# Patient Record
Sex: Male | Born: 1965
Health system: Southern US, Community
[De-identification: ages and names within clinical notes are randomized; demographics above are authoritative.]

## PROBLEM LIST (undated history)

## (undated) DIAGNOSIS — Z86718 Personal history of other venous thrombosis and embolism: Secondary | ICD-10-CM

## (undated) DIAGNOSIS — Z1211 Encounter for screening for malignant neoplasm of colon: Secondary | ICD-10-CM

## (undated) DIAGNOSIS — G4730 Sleep apnea, unspecified: Secondary | ICD-10-CM

## (undated) DIAGNOSIS — Z0000 Encounter for general adult medical examination without abnormal findings: Secondary | ICD-10-CM

## (undated) DIAGNOSIS — E785 Hyperlipidemia, unspecified: Secondary | ICD-10-CM

## (undated) DIAGNOSIS — F1910 Other psychoactive substance abuse, uncomplicated: Secondary | ICD-10-CM

## (undated) DIAGNOSIS — R7989 Other specified abnormal findings of blood chemistry: Principal | ICD-10-CM

## (undated) DIAGNOSIS — I82409 Acute embolism and thrombosis of unspecified deep veins of unspecified lower extremity: Secondary | ICD-10-CM

## (undated) DIAGNOSIS — R739 Hyperglycemia, unspecified: Secondary | ICD-10-CM

## (undated) DIAGNOSIS — E669 Obesity, unspecified: Secondary | ICD-10-CM

## (undated) DIAGNOSIS — R05 Cough: Secondary | ICD-10-CM

## (undated) HISTORY — DX: Acute embolism and thrombosis of unspecified deep veins of unspecified lower extremity: I82.409

## (undated) HISTORY — DX: Other psychoactive substance abuse, uncomplicated: F19.10

## (undated) HISTORY — DX: Obesity, unspecified: E66.9

## (undated) HISTORY — DX: Other specified abnormal findings of blood chemistry: R79.89

## (undated) HISTORY — DX: Sleep apnea, unspecified: G47.30

## (undated) HISTORY — DX: Personal history of other venous thrombosis and embolism: Z86.718

## (undated) HISTORY — DX: Encounter for screening for malignant neoplasm of colon: Z12.11

## (undated) HISTORY — DX: Cough: R05

## (undated) HISTORY — DX: Hyperlipidemia, unspecified: E78.5

## (undated) HISTORY — DX: Encounter for general adult medical examination without abnormal findings: Z00.00

## (undated) HISTORY — PX: OTHER SURGICAL HISTORY: SHX169

## (undated) HISTORY — DX: Hyperglycemia, unspecified: R73.9

---

## 1990-09-02 HISTORY — PX: SKIN GRAFT SPLIT THICKNESS TRUNK: SUR1304

## 1990-09-02 HISTORY — PX: SKIN GRAFT SPLIT THICKNESS LEG / FOOT: SUR1303

## 1990-09-02 HISTORY — PX: ORIF FEMUR FRACTURE: SHX2119

## 2010-01-26 ENCOUNTER — Ambulatory Visit: Payer: Self-pay | Admitting: Internal Medicine

## 2010-01-26 DIAGNOSIS — L02229 Furuncle of trunk, unspecified: Secondary | ICD-10-CM

## 2010-01-26 DIAGNOSIS — L02239 Carbuncle of trunk, unspecified: Secondary | ICD-10-CM | POA: Insufficient documentation

## 2010-01-26 DIAGNOSIS — H8110 Benign paroxysmal vertigo, unspecified ear: Secondary | ICD-10-CM | POA: Insufficient documentation

## 2010-01-26 LAB — CONVERTED CEMR LAB
BUN: 9 mg/dL (ref 6–23)
CO2: 24 meq/L (ref 19–32)
Glucose, Bld: 97 mg/dL (ref 70–99)
Hgb A1c MFr Bld: 6 % — ABNORMAL HIGH (ref <5.7)
Potassium: 4.8 meq/L (ref 3.5–5.3)
Sodium: 138 meq/L (ref 135–145)
TSH: 1.032 microintl units/mL (ref 0.350–4.500)
Total CHOL/HDL Ratio: 4.7
VLDL: 33 mg/dL (ref 0–40)

## 2010-01-31 ENCOUNTER — Encounter: Payer: Self-pay | Admitting: Internal Medicine

## 2010-04-30 ENCOUNTER — Ambulatory Visit: Payer: Self-pay | Admitting: Internal Medicine

## 2010-04-30 DIAGNOSIS — M25539 Pain in unspecified wrist: Secondary | ICD-10-CM | POA: Insufficient documentation

## 2010-10-02 NOTE — Assessment & Plan Note (Signed)
Summary: 3 month fu/dt   Vital Signs:  Patient profile:   45 year old male Weight:      273.75 pounds BMI:     41.47 O2 Sat:      100 % on Room air Temp:     98.1 degrees F oral Pulse rate:   72 / minute Pulse rhythm:   regular Resp:     16 per minute BP sitting:   110 / 70  (right arm) Cuff size:   large  Vitals Entered By: Glendell Docker CMA (April 30, 2010 9:06 AM)  O2 Flow:  Room air CC: 3 Month Follow up  Is Patient Diabetic? Yes Pain Assessment Patient in pain? no      Comments evaluation of lump on left wrist   Primary Care [facility]:  Dondra Spry DO  CC:  3 Month Follow up .  History of Present Illness: 45 y/o AA male c/o left wrist bump no hx of recent trauma or fall occ bothers him minimal tenderness  obesity - exercising regularly - mainly walking lost 6 lbs  prev boil on back resolved doxy  Preventive Screening-Counseling & Management  Alcohol-Tobacco     Smoking Status: never  Allergies (verified): No Known Drug Allergies  Past History:  Past Medical History: Obesity  Family History: Family History of Arthritis Family History Breast cancer Family History of Colon CA - MGF (later in life) Family History High cholesterol - M Family History Hypertension -M  Family History Kidney disease - uncle Family History of Prostate CA - grandfather (died of prostate ca -died in his 48s) Family History of Stroke F 1st degree relative <60  father died of liver cancer at 89  Mother is 68   Social History: Occupation:  Engineer, 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   Lungs:  normal respiratory effort, normal breath sounds, no crackles, and no wheezes.   Heart:  normal rate, regular rhythm, and no gallop.   Msk:  left wrist - slightly prominence to dorsal wrist near  radius   Impression & Recommendations:  Problem # 1:  WRIST PAIN, LEFT (ICD-719.43) intermittent left wrist pain.  small prominent area doral aspect of wrist - question bony prominence vs ganglion cyst observe for now.  use wrist splint to see if it helps x 1-2 weeks If it becomes more symptomatic, consider referral to hand specialist  Problem # 2:  MORBID OBESITY (ICD-278.01) Pt counseled on diet and exercise.  he increased physical activity.  lost 6 lbs since June A1c 6.0  Patient Instructions: 1)  Please schedule a follow-up appointment in 6 months. 2)  BMP prior to visit, ICD-9:  790.29 3)  HbgA1C prior to visit, ICD-9: 790.29 4)  Please return for lab work one (1) week before your next appointment.  5)  Goal weight 250 lbs within 6 months  Current Allergies (reviewed today): No known allergies   Appended Document: Orders Update    Clinical Lists Changes  Orders: Added new Service order of Influenza Vaccine NON MCR (04540) - Signed Added new Service order of Admin 1st Vaccine ([id number]) - Signed Observations: Added new observation of FLU VAX#1VIS: 03/27/2010 (04/30/2010 10:07) Added new observation of FLU VAXLOT: [id number] (04/30/2010 10:07) Added new observation of FLU VAX EXP: 03/02/2011 (04/30/2010 10:07) Added new observation of FLU VAXBY: Darlene  Knight CMA (04/30/2010 10:07) Added new observation of FLU VAXRTE: IM (04/30/2010 10:07) Added new observation of FLU VAX DSE: 0.5 ml (04/30/2010 10:07) Added new observation of FLU VAXMFR: GlaxoSmithKline (04/30/2010 10:07) Added new observation of FLU VAX SITE: left deltoid (04/30/2010 10:07) Added new observation of FLU VAX: Fluvax Non-MCR (04/30/2010 10:07)       Immunizations Administered:  Influenza Vaccine # 1:    Vaccine Type: Fluvax Non-MCR    Site: left deltoid    Mfr: GlaxoSmithKline    Dose: 0.5 ml    Route: IM    Given by: Glendell Docker CMA    Exp. Date: 03/02/2011    Lot #: [id number]    VIS  given: 03/27/2010  Flu Vaccine Consent Questions:    Do you have a history of severe allergic reactions to this vaccine? no    Any prior history of allergic reactions to egg and/or gelatin? no    Do you have a sensitivity to the preservative Thimersol? no    Do you have a past history of Guillan-Barre Syndrome? no    Do you currently have an acute febrile illness? no    Have you ever had a severe reaction to latex? no    Vaccine information given and explained to patient? yes
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
## 2010-10-02 NOTE — Letter (Signed)
    at [facility] [address] [place], [place]  [address]  [place] Phone: [phone]      January 31, 2010   DEMARQUIS Popov [address] [place], [place] [address]  RE:  LAB RESULTS  Dear  Mr. SIRICO,  The following is an interpretation of your most recent lab tests.  Please take note of any instructions provided or changes to medications that have resulted from your lab work.  ELECTROLYTES:  Good - no changes needed  KIDNEY FUNCTION TESTS:  Good - no changes needed  LIPID PANEL:  Fair - review at your next visit Triglyceride: 166   Cholesterol: 191   LDL: 117   HDL: 41   Chol/HDL%:  4.7 Ratio  THYROID STUDIES:  Thyroid studies normal TSH: 1.032     DIABETIC STUDIES:  Fair - schedule a follow-up appointment Blood Glucose: 97   HgbA1C: 6.0     Your blood work shows you are a borderline diabetic.  Please avoid sweets and limit your carbohydrate intake to 25-30 grams per meal ( 90 grams per day).  I will further discuss your lab results at your next follow up appointment.         Sincerely Yours,    Dr. Thomos Lemons

## 2010-10-29 ENCOUNTER — Ambulatory Visit (INDEPENDENT_AMBULATORY_CARE_PROVIDER_SITE_OTHER): Payer: 59 | Admitting: Internal Medicine

## 2010-10-29 ENCOUNTER — Encounter: Payer: Self-pay | Admitting: Internal Medicine

## 2010-10-29 DIAGNOSIS — R7309 Other abnormal glucose: Secondary | ICD-10-CM | POA: Insufficient documentation

## 2010-10-29 DIAGNOSIS — Z125 Encounter for screening for malignant neoplasm of prostate: Secondary | ICD-10-CM
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
## 2011-06-17 ENCOUNTER — Telehealth: Payer: Self-pay | Admitting: Internal Medicine

## 2011-06-17 DIAGNOSIS — Z125 Encounter for screening for malignant neoplasm of prostate: Secondary | ICD-10-CM

## 2011-06-17 DIAGNOSIS — Z0000 Encounter for general adult medical examination without abnormal findings: Secondary | ICD-10-CM

## 2011-06-17 NOTE — Telephone Encounter (Signed)
CPX labs ,cbc, Lipid, Hepatic, Bmp, TSH, Urine Microscopic,- A1c are there any other labs needed for patient ?

## 2011-06-17 NOTE — Telephone Encounter (Signed)
ua with reflex micro instead of urine micro. Also last noted 2011 states he had requested prostate cancer screening with psa but don't see results. Assume he still wants this done so add psa pls

## 2011-06-17 NOTE — Telephone Encounter (Signed)
Patient has cpe on 10/21/11. Please schedule labs one week prior. Patient will be going downstairs to [facility].

## 2011-06-17 NOTE — Telephone Encounter (Signed)
Physical labs entered for February 2013 at [facility].

## 2011-10-21 ENCOUNTER — Encounter: Payer: 59 | Admitting: Internal Medicine

## 2011-10-30 ENCOUNTER — Other Ambulatory Visit: Payer: Self-pay | Admitting: *Deleted

## 2011-10-30 DIAGNOSIS — Z125 Encounter for screening for malignant neoplasm of prostate: Secondary | ICD-10-CM

## 2011-10-30 DIAGNOSIS — Z0000 Encounter for general adult medical examination without abnormal findings: Secondary | ICD-10-CM

## 2011-10-30 LAB — HEPATIC FUNCTION PANEL
ALT: 33 U/L (ref 0–53)
Bilirubin, Direct: 0.1 mg/dL (ref 0.0–0.3)
Indirect Bilirubin: 0.3 mg/dL (ref 0.0–0.9)
Total Bilirubin: 0.4 mg/dL (ref 0.3–1.2)

## 2011-10-30 LAB — TSH: TSH: 3.711 u[IU]/mL (ref 0.350–4.500)

## 2011-10-30 LAB — BASIC METABOLIC PANEL
CO2: 25 mEq/L (ref 19–32)
Calcium: 8.7 mg/dL (ref 8.4–10.5)
Chloride: 104 mEq/L (ref 96–112)
Glucose, Bld: 93 mg/dL (ref 70–99)
Potassium: 4.6 mEq/L (ref 3.5–5.3)
Sodium: 139 mEq/L (ref 135–145)

## 2011-10-30 LAB — LIPID PANEL
LDL Cholesterol: 101 mg/dL — ABNORMAL HIGH (ref 0–99)
Total CHOL/HDL Ratio: 5.1 Ratio
VLDL: 44 mg/dL — ABNORMAL HIGH (ref 0–40)

## 2011-10-31 ENCOUNTER — Encounter: Payer: Self-pay | Admitting: Internal Medicine

## 2011-10-31 ENCOUNTER — Telehealth: Payer: Self-pay | Admitting: Internal Medicine

## 2011-10-31 ENCOUNTER — Ambulatory Visit (INDEPENDENT_AMBULATORY_CARE_PROVIDER_SITE_OTHER): Payer: 59 | Admitting: Internal Medicine

## 2011-10-31 DIAGNOSIS — E781 Pure hyperglyceridemia: Secondary | ICD-10-CM

## 2011-10-31 DIAGNOSIS — R7309 Other abnormal glucose: Secondary | ICD-10-CM

## 2011-10-31 DIAGNOSIS — E785 Hyperlipidemia, unspecified: Secondary | ICD-10-CM

## 2011-10-31 DIAGNOSIS — Z0000 Encounter for general adult medical examination without abnormal findings: Secondary | ICD-10-CM

## 2011-10-31 DIAGNOSIS — Z23 Encounter for immunization: Secondary | ICD-10-CM

## 2011-10-31 LAB — CBC
MCHC: 32.3 g/dL (ref 30.0–36.0)
Platelets: 255 10*3/uL (ref 150–400)
RDW: 12.9 % (ref 11.5–15.5)
WBC: 8.4 10*3/uL (ref 4.0–10.5)

## 2011-10-31 LAB — URINALYSIS, MICROSCOPIC ONLY: Casts: NONE SEEN

## 2011-10-31 LAB — URINALYSIS, ROUTINE W REFLEX MICROSCOPIC
Hgb urine dipstick: NEGATIVE
Leukocytes, UA: NEGATIVE
Nitrite: NEGATIVE
Protein, ur: NEGATIVE mg/dL
Urobilinogen, UA: 0.2 mg/dL (ref 0.0–1.0)

## 2011-10-31 NOTE — Telephone Encounter (Signed)
Future lab order placed for the week of 04/20/12 and copy forwarded to the lab.

## 2011-10-31 NOTE — Patient Instructions (Signed)
Please schedule chem7, a1c (hyperglycemia) and lipid 272.4 prior to next visit 

## 2011-10-31 NOTE — Assessment & Plan Note (Signed)
Nl exam. ekg obtained demonstrates nsr 78 with nl intervals and axis.

## 2011-10-31 NOTE — Assessment & Plan Note (Signed)
Mild but reviewed potential for development of DM. Recommend low sugar/carb diet, regular aerobic exercise and wt loss.

## 2011-10-31 NOTE — Assessment & Plan Note (Signed)
Low fat diet, regular aerobic exercise and wt loss recommended.

## 2011-10-31 NOTE — Progress Notes (Signed)
  Subjective:    Patient ID: Ross Spencer, male    DOB: 1966-04-07, 46 y.o.   MRN: [id number]  HPI Pt presents to clinic for annual physical. Reviewed h/o hyperglycemia- a1c improved mildly to 5.9. No diagnostic criteria noted for dm. TG reviewed elevated and has gained wt since last visit. No active complaints.  No past medical history on file. Past Surgical History  Procedure Date  . No past surgeries 10/31/2011    reports that he has never smoked. He has never used smokeless tobacco. He reports that he does not drink alcohol or use illicit drugs. family history includes Breast cancer in his maternal grandmother; Hypertension in his mother; and Prostate cancer in his maternal uncle.  There is no history of Colon cancer, and Diabetes, and Heart disease, and Hyperlipidemia, . No Known Allergies   Review of Systems see hpi     Objective:   Physical Exam  Nursing note and vitals reviewed. Constitutional: He appears well-developed and well-nourished. No distress.  HENT:  Head: Normocephalic and atraumatic.  Right Ear: Tympanic membrane, external ear and ear canal normal.  Left Ear: Tympanic membrane, external ear and ear canal normal.  Nose: Nose normal.  Mouth/Throat: Oropharynx is clear and moist. No oropharyngeal exudate.  Eyes: Conjunctivae and EOM are normal. Pupils are equal, round, and reactive to light. No scleral icterus.  Neck: Neck supple. No thyromegaly present.  Cardiovascular: Normal rate, regular rhythm and normal heart sounds.  Exam reveals no gallop and no friction rub.   No murmur heard. Pulmonary/Chest: Effort normal and breath sounds normal. No respiratory distress. He has no wheezes. He has no rales.  Abdominal: Soft. Bowel sounds are normal. He exhibits no distension and no mass. There is no hepatosplenomegaly. There is no tenderness. There is no rebound and no guarding.  Lymphadenopathy:    He has no cervical adenopathy.  Neurological: He is alert.  Skin:  Skin is warm and dry. He is not diaphoretic.  Psychiatric: He has a normal mood and affect.          Assessment & Plan:

## 2012-04-24 LAB — LIPID PANEL
Cholesterol: 199 mg/dL (ref 0–200)
HDL: 39 mg/dL — ABNORMAL LOW (ref >39)
LDL Cholesterol: 116 mg/dL — ABNORMAL HIGH (ref 0–99)
Total CHOL/HDL Ratio: 5.1 Ratio
Triglycerides: 222 mg/dL — ABNORMAL HIGH (ref <150)
VLDL: 44 mg/dL — ABNORMAL HIGH (ref 0–40)

## 2012-04-24 LAB — BASIC METABOLIC PANEL
CO2: 28 mEq/L (ref 19–32)
Chloride: 104 mEq/L (ref 96–112)
Creat: 0.93 mg/dL (ref 0.50–1.35)

## 2012-04-24 NOTE — Addendum Note (Signed)
Addended by: Regis Bill on: 04/24/2012 08:15 AM   Modules accepted: Orders

## 2012-04-27 ENCOUNTER — Encounter: Payer: Self-pay | Admitting: Internal Medicine

## 2012-04-27 ENCOUNTER — Telehealth: Payer: Self-pay | Admitting: Internal Medicine

## 2012-04-27 ENCOUNTER — Ambulatory Visit (INDEPENDENT_AMBULATORY_CARE_PROVIDER_SITE_OTHER): Payer: 59 | Admitting: Internal Medicine

## 2012-04-27 VITALS — BP 108/72 | HR 86 | Temp 98.2°F | Resp 16 | Ht 68.25 in | Wt 283.8 lb

## 2012-04-27 DIAGNOSIS — E781 Pure hyperglyceridemia: Secondary | ICD-10-CM

## 2012-04-27 DIAGNOSIS — R7309 Other abnormal glucose: Secondary | ICD-10-CM

## 2012-04-27 DIAGNOSIS — Z0000 Encounter for general adult medical examination without abnormal findings: Secondary | ICD-10-CM

## 2012-04-27 NOTE — Assessment & Plan Note (Signed)
Stable despite weight increase. Strongly encouraged need for diet changes, regular exercise and wt loss. Repeat labs with cpe next visit

## 2012-04-27 NOTE — Patient Instructions (Signed)
Please schedule fasting labs prior to next appointment Cbc, chem7, lft, a1c, tsh, lipid, ua with reflex micro and psa-v70.0

## 2012-04-27 NOTE — Assessment & Plan Note (Signed)
Low fat diet and exercise.

## 2012-04-27 NOTE — Assessment & Plan Note (Signed)
Begin exercise program

## 2012-04-27 NOTE — Telephone Encounter (Signed)
Please schedule fasting labs prior to next appointment  Cbc, chem7, lft, a1c, tsh, lipid, ua with reflex micro and psa-v70.0  Patient has upcoming appointment March/2014. He will be going to [facility] lab

## 2012-04-27 NOTE — Progress Notes (Signed)
  Subjective:    Patient ID: Ross Spencer, male    DOB: [birth year], 46 y.o.   MRN: [id number]  HPI Pt presents to clinic for followup of multiple medical problems. Wt up 4lbs since last visit. Reviewed persistent but stable hyperglycemia with a1c 6.0. TG reviewed also elevated and stable. Not exercising regularly. No complaint.  No past medical history on file. Past Surgical History  Procedure Date  . No past surgeries 10/31/2011    reports that he has never smoked. He has never used smokeless tobacco. He reports that he does not drink alcohol or use illicit drugs. family history includes Breast cancer in his maternal grandmother; Hypertension in his mother; and Prostate cancer in his maternal uncle.  There is no history of Colon cancer, and Diabetes, and Heart disease, and Hyperlipidemia, . No Known Allergies    Review of Systems see hpi     Objective:   Physical Exam  Physical Exam  Nursing note and vitals reviewed. Constitutional: Appears well-developed and well-nourished. No distress.  HENT:  Head: Normocephalic and atraumatic.  Right Ear: External ear normal.  Left Ear: External ear normal.  Eyes: Conjunctivae are normal. No scleral icterus.  Neck: Neck supple. Carotid bruit is not present.  Cardiovascular: Normal rate, regular rhythm and normal heart sounds.  Exam reveals no gallop and no friction rub.   No murmur heard. Pulmonary/Chest: Effort normal and breath sounds normal. No respiratory distress. He has no wheezes. no rales.  Lymphadenopathy:    He has no cervical adenopathy.  Neurological:Alert.  Skin: Skin is warm and dry. Not diaphoretic.  Psychiatric: Has a normal mood and affect.        Assessment & Plan:

## 2012-05-07 NOTE — Telephone Encounter (Signed)
Future orders placed and copy given to the lab.

## 2012-10-29 ENCOUNTER — Other Ambulatory Visit: Payer: Self-pay

## 2012-10-29 DIAGNOSIS — Z0000 Encounter for general adult medical examination without abnormal findings: Secondary | ICD-10-CM

## 2012-10-29 LAB — URINALYSIS, ROUTINE W REFLEX MICROSCOPIC
Bilirubin Urine: NEGATIVE
Glucose, UA: NEGATIVE mg/dL
Hgb urine dipstick: NEGATIVE
Leukocytes, UA: NEGATIVE
Protein, ur: NEGATIVE mg/dL

## 2012-10-29 LAB — LIPID PANEL
Cholesterol: 188 mg/dL (ref 0–200)
Total CHOL/HDL Ratio: 4.6 Ratio
VLDL: 35 mg/dL (ref 0–40)

## 2012-10-29 LAB — HEMOGLOBIN A1C
Hgb A1c MFr Bld: 5.9 % — ABNORMAL HIGH (ref <5.7)
Mean Plasma Glucose: 123 mg/dL — ABNORMAL HIGH (ref <117)

## 2012-10-29 LAB — HEPATIC FUNCTION PANEL
AST: 20 U/L (ref 0–37)
Alkaline Phosphatase: 63 U/L (ref 39–117)
Bilirubin, Direct: 0.1 mg/dL (ref 0.0–0.3)
Total Bilirubin: 0.4 mg/dL (ref 0.3–1.2)

## 2012-10-29 LAB — BASIC METABOLIC PANEL
Potassium: 4.4 mEq/L (ref 3.5–5.3)
Sodium: 139 mEq/L (ref 135–145)

## 2012-10-30 LAB — CBC WITH DIFFERENTIAL/PLATELET
Basophils Relative: 0 % (ref 0–1)
Hemoglobin: 15.6 g/dL (ref 13.0–17.0)
Lymphocytes Relative: 52 % — ABNORMAL HIGH (ref 12–46)
Lymphs Abs: 4.4 10*3/uL — ABNORMAL HIGH (ref 0.7–4.0)
Monocytes Relative: 9 % (ref 3–12)
Neutro Abs: 3.1 10*3/uL (ref 1.7–7.7)
Neutrophils Relative %: 37 % — ABNORMAL LOW (ref 43–77)
RBC: 5.54 MIL/uL (ref 4.22–5.81)
WBC: 8.5 10*3/uL (ref 4.0–10.5)

## 2012-11-03 ENCOUNTER — Encounter: Payer: 59 | Admitting: Family Medicine

## 2012-11-24 ENCOUNTER — Encounter: Payer: Self-pay | Admitting: Family Medicine

## 2012-11-24 ENCOUNTER — Ambulatory Visit (INDEPENDENT_AMBULATORY_CARE_PROVIDER_SITE_OTHER): Payer: 59 | Admitting: Family Medicine

## 2012-11-24 VITALS — BP 132/84 | HR 93 | Temp 98.2°F | Ht 68.25 in | Wt 282.1 lb

## 2012-11-24 DIAGNOSIS — E785 Hyperlipidemia, unspecified: Secondary | ICD-10-CM

## 2012-11-24 DIAGNOSIS — E669 Obesity, unspecified: Secondary | ICD-10-CM | POA: Insufficient documentation

## 2012-11-24 DIAGNOSIS — R7989 Other specified abnormal findings of blood chemistry: Secondary | ICD-10-CM

## 2012-11-24 DIAGNOSIS — R946 Abnormal results of thyroid function studies: Secondary | ICD-10-CM

## 2012-11-24 DIAGNOSIS — Z0000 Encounter for general adult medical examination without abnormal findings: Secondary | ICD-10-CM

## 2012-11-24 DIAGNOSIS — E782 Mixed hyperlipidemia: Secondary | ICD-10-CM | POA: Insufficient documentation

## 2012-11-24 DIAGNOSIS — R7309 Other abnormal glucose: Secondary | ICD-10-CM

## 2012-11-24 DIAGNOSIS — H8110 Benign paroxysmal vertigo, unspecified ear: Secondary | ICD-10-CM

## 2012-11-24 HISTORY — DX: Other specified abnormal findings of blood chemistry: R79.89

## 2012-11-24 NOTE — Assessment & Plan Note (Signed)
Elevated TSH noted, repeat TSH with free T4 today

## 2012-11-24 NOTE — Assessment & Plan Note (Signed)
Mild, encouraged avoid trans fats, add exercise, minimize simple carbs and saturated fats and add a krill or fish oil cap daily
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
## 2012-11-24 NOTE — Assessment & Plan Note (Signed)
Encouraged adding regular exercise, avoid trans fats and maintain a heart healthy diet.

## 2012-11-24 NOTE — Assessment & Plan Note (Signed)
hgba1c 5.9, stable, minimize simple carbs.

## 2012-11-24 NOTE — Patient Instructions (Addendum)
Start a krill oil or fish oil cap daily, MegaRed by [employer] No trans fats/partially hydrogenated oils  Labs prior to visit, hgba1c, lipid, renal, cbc, tsh, freeT4, hepatic  Preventive Care for Adults, Male A healthy lifestyle and preventive care can promote health and wellness. Preventive health guidelines for men include the following key practices:  A routine yearly physical is a good way to check with your caregiver about your health and preventative screening. It is a chance to share any concerns and updates on your health, and to receive a thorough exam.  Visit your dentist for a routine exam and preventative care every 6 months. Brush your teeth twice a day and floss once a day. Good oral hygiene prevents tooth decay and gum disease.  The frequency of eye exams is based on your age, health, family medical history, use of contact lenses, and other factors. Follow your caregiver's recommendations for frequency of eye exams.  Eat a healthy diet. Foods like vegetables, fruits, whole grains, low-fat dairy products, and lean protein foods contain the nutrients you need without too many calories. Decrease your intake of foods high in solid fats, added sugars, and salt. Eat the right amount of calories for you.Get information about a proper diet from your caregiver, if necessary.  Regular physical exercise is one of the most important things you can do for your health. Most adults should get at least 150 minutes of moderate-intensity exercise (any activity that increases your heart rate and causes you to sweat) each week. In addition, most adults need muscle-strengthening exercises on 2 or more days a week.  Maintain a healthy weight. The body mass index (BMI) is a screening tool to identify possible weight problems. It provides an estimate of body fat based on height and weight. Your caregiver can help determine your BMI, and can help you achieve or maintain a healthy weight.For adults 20 years and  older:  A BMI below 18.5 is considered underweight.  A BMI of 18.5 to 24.9 is normal.  A BMI of 25 to 29.9 is considered overweight.  A BMI of 30 and above is considered obese.  Maintain normal blood lipids and cholesterol levels by exercising and minimizing your intake of saturated fat. Eat a balanced diet with plenty of fruit and vegetables. Blood tests for lipids and cholesterol should begin at age 6 and be repeated every 5 years. If your lipid or cholesterol levels are high, you are over 50, or you are a high risk for heart disease, you may need your cholesterol levels checked more frequently.Ongoing high lipid and cholesterol levels should be treated with medicines if diet and exercise are not effective.  If you smoke, find out from your caregiver how to quit. If you do not use tobacco, do not start.  If you choose to drink alcohol, do not exceed 2 drinks per day. One drink is considered to be 12 ounces (355 mL) of beer, 5 ounces (148 mL) of wine, or 1.5 ounces (44 mL) of liquor.  Avoid use of street drugs. Do not share needles with anyone. Ask for help if you need support or instructions about stopping the use of drugs.  High blood pressure causes heart disease and increases the risk of stroke. Your blood pressure should be checked at least every 1 to 2 years. Ongoing high blood pressure should be treated with medicines, if weight loss and exercise are not effective.  If you are 71 to 46 years old, ask your caregiver if you  should take aspirin to prevent heart disease.  Diabetes screening involves taking a blood sample to check your fasting blood sugar level. This should be done once every 3 years, after age 89, if you are within normal weight and without risk factors for diabetes. Testing should be considered at a younger age or be carried out more frequently if you are overweight and have at least 1 risk factor for diabetes.  Colorectal cancer can be detected and often prevented.  Most routine colorectal cancer screening begins at the age of 55 and continues through age 48. However, your caregiver may recommend screening at an earlier age if you have risk factors for colon cancer. On a yearly basis, your caregiver may provide home test kits to check for hidden blood in the stool. Use of a small camera at the end of a tube, to directly examine the colon (sigmoidoscopy or colonoscopy), can detect the earliest forms of colorectal cancer. Talk to your caregiver about this at age 74, when routine screening begins. Direct examination of the colon should be repeated every 5 to 10 years through age 11, unless early forms of pre-cancerous polyps or small growths are found.  Hepatitis C blood testing is recommended for all people born from [birth year] through 1965 and any individual with known risks for hepatitis C.  Practice safe sex. Use condoms and avoid high-risk sexual practices to reduce the spread of sexually transmitted infections (STIs). STIs include gonorrhea, chlamydia, syphilis, trichomonas, herpes, HPV, and human immunodeficiency virus (HIV). Herpes, HIV, and HPV are viral illnesses that have no cure. They can result in disability, cancer, and death.  A one-time screening for abdominal aortic aneurysm (AAA) and surgical repair of large AAAs by sound wave imaging (ultrasonography) is recommended for ages 77 to 40 years who are current or former smokers.  Healthy men should no longer receive prostate-specific antigen (PSA) blood tests as part of routine cancer screening. Consult with your caregiver about prostate cancer screening.  Testicular cancer screening is not recommended for adult males who have no symptoms. Screening includes self-exam, caregiver exam, and other screening tests. Consult with your caregiver about any symptoms you have or any concerns you have about testicular cancer.  Use sunscreen with skin protection factor (SPF) of 30 or more. Apply sunscreen liberally and  repeatedly throughout the day. You should seek shade when your shadow is shorter than you. Protect yourself by wearing long sleeves, pants, a wide-brimmed hat, and sunglasses year round, whenever you are outdoors.  Once a month, do a whole body skin exam, using a mirror to look at the skin on your back. Notify your caregiver of new moles, moles that have irregular borders, moles that are larger than a pencil eraser, or moles that have changed in shape or color.  Stay current with required immunizations.  Influenza. You need a dose every fall (or winter). The composition of the flu vaccine changes each year, so being vaccinated once is not enough.  Pneumococcal polysaccharide. You need 1 to 2 doses if you smoke cigarettes or if you have certain chronic medical conditions. You need 1 dose at age 17 (or older) if you have never been vaccinated.  Tetanus, diphtheria, pertussis (Tdap, Td). Get 1 dose of Tdap vaccine if you are younger than age 40 years, are over 66 and have contact with an infant, are a Research scientist (physical sciences), or simply want to be protected from whooping cough. After that, you need a Td booster dose every 10 years. Consult your  caregiver if you have not had at least 3 tetanus and diphtheria-containing shots sometime in your life or have a deep or dirty wound.  HPV. This vaccine is recommended for males 13 through 46 years of age. This vaccine may be given to men 22 through 46 years of age who have not completed the 3 dose series. It is recommended for men through age 61 who have sex with men or whose immune system is weakened because of HIV infection, other illness, or medications. The vaccine is given in 3 doses over 6 months.  Measles, mumps, rubella (MMR). You need at least 1 dose of MMR if you were born in 1957 or later. You may also need a 2nd dose.  Meningococcal. If you are age 9 to 63 years and a Orthoptist living in a residence hall, or have one of several medical  conditions, you need to get vaccinated against meningococcal disease. You may also need additional booster doses.  Zoster (shingles). If you are age 44 years or older, you should get this vaccine.  Varicella (chickenpox). If you have never had chickenpox or you were vaccinated but received only 1 dose, talk to your caregiver to find out if you need this vaccine.  Hepatitis A. You need this vaccine if you have a specific risk factor for hepatitis A virus infection, or you simply wish to be protected from this disease. The vaccine is usually given as 2 doses, 6 to 18 months apart.  Hepatitis B. You need this vaccine if you have a specific risk factor for hepatitis B virus infection or you simply wish to be protected from this disease. The vaccine is given in 3 doses, usually over 6 months. Preventative Service / Frequency Ages 46 to 22  Blood pressure check.** / Every 1 to 2 years.  Lipid and cholesterol check.** / Every 5 years beginning at age 47.  Hepatitis C blood test.** / For any individual with known risks for hepatitis C.  Skin self-exam. / Monthly.  Influenza immunization.** / Every year.  Pneumococcal polysaccharide immunization.** / 1 to 2 doses if you smoke cigarettes or if you have certain chronic medical conditions.  Tetanus, diphtheria, pertussis (Tdap,Td) immunization. / A one-time dose of Tdap vaccine. After that, you need a Td booster dose every 10 years.  HPV immunization. / 3 doses over 6 months, if 26 and younger.  Measles, mumps, rubella (MMR) immunization. / You need at least 1 dose of MMR if you were born in 1957 or later. You may also need a 2nd dose.  Meningococcal immunization. / 1 dose if you are age 23 to 56 years and a Orthoptist living in a residence hall, or have one of several medical conditions, you need to get vaccinated against meningococcal disease. You may also need additional booster doses.  Varicella immunization.** / Consult your  caregiver.  Hepatitis A immunization.** / Consult your caregiver. 2 doses, 6 to 18 months apart.  Hepatitis B immunization.** / Consult your caregiver. 3 doses usually over 6 months. Ages 78 to 16  Blood pressure check.** / Every 1 to 2 years.  Lipid and cholesterol check.** / Every 5 years beginning at age 57.  Fecal occult blood test (FOBT) of stool. / Every year beginning at age 45 and continuing until age 84. You may not have to do this test if you get colonoscopy every 10 years.  Flexible sigmoidoscopy** or colonoscopy.** / Every 5 years for a flexible sigmoidoscopy or every  10 years for a colonoscopy beginning at age 36 and continuing until age 39.  Hepatitis C blood test.** / For all people born from [birth year] through 1965 and any individual with known risks for hepatitis C.  Skin self-exam. / Monthly.  Influenza immunization.** / Every year.  Pneumococcal polysaccharide immunization.** / 1 to 2 doses if you smoke cigarettes or if you have certain chronic medical conditions.  Tetanus, diphtheria, pertussis (Tdap/Td) immunization.** / A one-time dose of Tdap vaccine. After that, you need a Td booster dose every 10 years.  Measles, mumps, rubella (MMR) immunization. / You need at least 1 dose of MMR if you were born in 1957 or later. You may also need a 2nd dose.  Varicella immunization.**/ Consult your caregiver.  Meningococcal immunization.** / Consult your caregiver.  Hepatitis A immunization.** / Consult your caregiver. 2 doses, 6 to 18 months apart.  Hepatitis B immunization.** / Consult your caregiver. 3 doses, usually over 6 months. Ages 59 and over  Blood pressure check.** / Every 1 to 2 years.  Lipid and cholesterol check.**/ Every 5 years beginning at age 38.  Fecal occult blood test (FOBT) of stool. / Every year beginning at age 64 and continuing until age 53. You may not have to do this test if you get colonoscopy every 10 years.  Flexible sigmoidoscopy** or  colonoscopy.** / Every 5 years for a flexible sigmoidoscopy or every 10 years for a colonoscopy beginning at age 50 and continuing until age 28.  Hepatitis C blood test.** / For all people born from [birth year] through 1965 and any individual with known risks for hepatitis C.  Abdominal aortic aneurysm (AAA) screening.** / A one-time screening for ages 45 to 71 years who are current or former smokers.  Skin self-exam. / Monthly.  Influenza immunization.** / Every year.  Pneumococcal polysaccharide immunization.** / 1 dose at age 72 (or older) if you have never been vaccinated.  Tetanus, diphtheria, pertussis (Tdap, Td) immunization. / A one-time dose of Tdap vaccine if you are over 65 and have contact with an infant, are a Research scientist (physical sciences), or simply want to be protected from whooping cough. After that, you need a Td booster dose every 10 years.  Varicella immunization. ** / Consult your caregiver.  Meningococcal immunization.** / Consult your caregiver.  Hepatitis A immunization. ** / Consult your caregiver. 2 doses, 6 to 18 months apart.  Hepatitis B immunization.** / Check with your caregiver. 3 doses, usually over 6 months. **Family history and personal history of risk and conditions may change your caregiver's recommendations. Document Released: 10/15/2001 Document Revised: 11/11/2011 Document Reviewed: 01/14/2011 [facility] Patient Information 2013 [place], [place].

## 2012-11-24 NOTE — Assessment & Plan Note (Signed)
Had a recent episode but resolved at this time. Encouraged good hydration and notify us if worsens

## 2012-11-24 NOTE — Assessment & Plan Note (Signed)
Encouraged regular exercise, heart healthy diet, seat belt use, patient is a nonsmoker. Attempt modest weight loss

## 2012-11-25 NOTE — Progress Notes (Signed)
Quick Note:  Patient Informed and voiced understanding ______ 

## 2013-05-24 ENCOUNTER — Encounter: Payer: Self-pay | Admitting: Family Medicine

## 2013-05-24 ENCOUNTER — Ambulatory Visit (INDEPENDENT_AMBULATORY_CARE_PROVIDER_SITE_OTHER): Payer: 59 | Admitting: Family Medicine

## 2013-05-24 ENCOUNTER — Other Ambulatory Visit: Payer: Self-pay | Admitting: Family Medicine

## 2013-05-24 VITALS — BP 130/80 | HR 97 | Temp 98.2°F | Ht 70.0 in | Wt 294.1 lb

## 2013-05-24 DIAGNOSIS — R946 Abnormal results of thyroid function studies: Secondary | ICD-10-CM

## 2013-05-24 DIAGNOSIS — R7309 Other abnormal glucose: Secondary | ICD-10-CM

## 2013-05-24 DIAGNOSIS — Z23 Encounter for immunization: Secondary | ICD-10-CM

## 2013-05-24 DIAGNOSIS — R7989 Other specified abnormal findings of blood chemistry: Secondary | ICD-10-CM

## 2013-05-24 DIAGNOSIS — E785 Hyperlipidemia, unspecified: Secondary | ICD-10-CM

## 2013-05-24 LAB — CBC
MCV: 85.5 fL (ref 78.0–100.0)
Platelets: 265 10*3/uL (ref 150–400)
RDW: 14.2 % (ref 11.5–15.5)
WBC: 7.7 10*3/uL (ref 4.0–10.5)

## 2013-05-24 LAB — HEPATIC FUNCTION PANEL
ALT: 26 U/L (ref 0–53)
AST: 18 U/L (ref 0–37)
Albumin: 4.1 g/dL (ref 3.5–5.2)
Alkaline Phosphatase: 63 U/L (ref 39–117)
Bilirubin, Direct: 0.1 mg/dL (ref 0.0–0.3)

## 2013-05-24 LAB — TSH: TSH: 2.3 u[IU]/mL (ref 0.350–4.500)

## 2013-05-24 LAB — LIPID PANEL
HDL: 42 mg/dL (ref >39)
Triglycerides: 206 mg/dL — ABNORMAL HIGH (ref <150)

## 2013-05-25 ENCOUNTER — Encounter: Payer: Self-pay | Admitting: Family Medicine

## 2013-05-25 NOTE — Assessment & Plan Note (Signed)
hgba1c up slightly to 6.2, minimize simple carbs and increase exercise
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
## 2013-05-25 NOTE — Assessment & Plan Note (Signed)
Avoid trans fats, increase exercise. Add krill oil caps

## 2013-05-25 NOTE — Assessment & Plan Note (Signed)
TSH wnl today

## 2013-05-25 NOTE — Assessment & Plan Note (Signed)
Unfortunately no weight loss, encouraged DASH diet and regular exercise

## 2013-05-31 ENCOUNTER — Emergency Department (HOSPITAL_BASED_OUTPATIENT_CLINIC_OR_DEPARTMENT_OTHER)
Admission: EM | Admit: 2013-05-31 | Discharge: 2013-05-31 | Disposition: A | Discharge status: Home / Self Care | Payer: 59 | Attending: Emergency Medicine | Admitting: Emergency Medicine

## 2013-05-31 ENCOUNTER — Encounter (HOSPITAL_BASED_OUTPATIENT_CLINIC_OR_DEPARTMENT_OTHER): Payer: Self-pay | Admitting: *Deleted

## 2013-05-31 DIAGNOSIS — Z8639 Personal history of other endocrine, nutritional and metabolic disease: Secondary | ICD-10-CM | POA: Insufficient documentation

## 2013-05-31 DIAGNOSIS — R42 Dizziness and giddiness: Secondary | ICD-10-CM

## 2013-05-31 DIAGNOSIS — E669 Obesity, unspecified: Secondary | ICD-10-CM | POA: Insufficient documentation

## 2013-05-31 DIAGNOSIS — Z862 Personal history of diseases of the blood and blood-forming organs and certain disorders involving the immune mechanism: Secondary | ICD-10-CM | POA: Insufficient documentation

## 2013-05-31 DIAGNOSIS — R110 Nausea: Secondary | ICD-10-CM | POA: Insufficient documentation

## 2013-05-31 MED ORDER — MECLIZINE HCL 25 MG PO TABS: 25.0000 mg | ORAL_TABLET | Freq: Three times a day (TID) | ORAL | Status: DC | PRN
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
## 2013-05-31 NOTE — ED Notes (Signed)
Pt reports dizziness "vertigo" since 0845 this am and nausea- states has had this several times before- has been taking dramamine

## 2013-08-14 ENCOUNTER — Emergency Department (HOSPITAL_BASED_OUTPATIENT_CLINIC_OR_DEPARTMENT_OTHER)
Admission: EM | Admit: 2013-08-14 | Discharge: 2013-08-14 | Disposition: A | Discharge status: Home / Self Care | Payer: 59 | Attending: Emergency Medicine | Admitting: Emergency Medicine

## 2013-08-14 ENCOUNTER — Encounter (HOSPITAL_BASED_OUTPATIENT_CLINIC_OR_DEPARTMENT_OTHER): Payer: Self-pay | Admitting: Emergency Medicine

## 2013-08-14 DIAGNOSIS — M79609 Pain in unspecified limb: Secondary | ICD-10-CM | POA: Insufficient documentation

## 2013-08-14 DIAGNOSIS — R000 Tachycardia, unspecified: Secondary | ICD-10-CM | POA: Insufficient documentation

## 2013-08-14 DIAGNOSIS — M7989 Other specified soft tissue disorders: Secondary | ICD-10-CM | POA: Insufficient documentation

## 2013-08-14 DIAGNOSIS — M79605 Pain in left leg: Secondary | ICD-10-CM

## 2013-08-14 DIAGNOSIS — E669 Obesity, unspecified: Secondary | ICD-10-CM | POA: Insufficient documentation

## 2013-08-14 NOTE — ED Notes (Signed)
Pt reports left leg pain onset Monday denies injury or event positive reddness and edema
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
## 2013-08-16 ENCOUNTER — Telehealth: Payer: Self-pay | Admitting: Family Medicine

## 2013-08-16 ENCOUNTER — Telehealth: Payer: Self-pay | Admitting: Physician Assistant

## 2013-08-16 DIAGNOSIS — M7989 Other specified soft tissue disorders: Secondary | ICD-10-CM

## 2013-08-16 NOTE — Telephone Encounter (Signed)
Spoke with patient concerning ER visit and symptoms.  Endorses left calf swelling with pain.  Denies chest pain, SOB, recent surgery, long trips, sedentary lifestyle.  Denies history of DVTor PE.  [place] ordered.

## 2013-08-16 NOTE — Telephone Encounter (Signed)
Labs prior to visit. Lipid, renal, cbc, tsh hepatic, hgba1c for appt 10/31/2013

## 2013-08-16 NOTE — Telephone Encounter (Signed)
Error

## 2013-08-17 ENCOUNTER — Encounter: Payer: Self-pay | Admitting: Family Medicine

## 2013-08-17 ENCOUNTER — Ambulatory Visit (HOSPITAL_BASED_OUTPATIENT_CLINIC_OR_DEPARTMENT_OTHER)
Admission: RE | Admit: 2013-08-17 | Discharge: 2013-08-17 | Disposition: A | Discharge status: Home / Self Care | Payer: 59 | Source: Ambulatory Visit | Attending: Physician Assistant | Admitting: Physician Assistant

## 2013-08-17 ENCOUNTER — Ambulatory Visit (INDEPENDENT_AMBULATORY_CARE_PROVIDER_SITE_OTHER): Payer: 59 | Admitting: Family Medicine

## 2013-08-17 ENCOUNTER — Telehealth: Payer: Self-pay | Admitting: *Deleted

## 2013-08-17 VITALS — BP 142/98 | HR 97 | Temp 98.3°F | Ht 70.0 in | Wt 294.1 lb

## 2013-08-17 DIAGNOSIS — M79609 Pain in unspecified limb: Secondary | ICD-10-CM | POA: Insufficient documentation

## 2013-08-17 DIAGNOSIS — I82409 Acute embolism and thrombosis of unspecified deep veins of unspecified lower extremity: Secondary | ICD-10-CM

## 2013-08-17 DIAGNOSIS — I82402 Acute embolism and thrombosis of unspecified deep veins of left lower extremity: Secondary | ICD-10-CM

## 2013-08-17 DIAGNOSIS — I82819 Embolism and thrombosis of superficial veins of unspecified lower extremities: Secondary | ICD-10-CM | POA: Insufficient documentation

## 2013-08-17 DIAGNOSIS — M7989 Other specified soft tissue disorders: Secondary | ICD-10-CM

## 2013-08-17 MED ORDER — RIVAROXABAN 20 MG PO TABS: 20.0000 mg | ORAL_TABLET | Freq: Every day | ORAL | Status: DC

## 2013-08-17 NOTE — Progress Notes (Signed)
Pre visit review using our clinic review tool, if applicable. No additional management support is needed unless otherwise documented below in the visit note. 

## 2013-08-17 NOTE — Telephone Encounter (Signed)
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 There is no deep venous thrombosis at or above the knee on  the left.   Per verbal from Provider, pt to start Xarelto 20mg  daily. 3 boxes given to pt. We will see pt today and discuss results.

## 2013-08-17 NOTE — Patient Instructions (Signed)
Deep Vein Thrombosis A deep vein thrombosis (DVT) is a blood clot that develops in a deep vein. A DVT is a clot in the deep, larger veins of the leg, arm, or pelvis. These are more dangerous than clots that might form in veins near the surface of the body. A DVT can lead to complications if the clot breaks off and travels in the bloodstream to the lungs.  A DVT can damage the valves in your leg veins, so that instead of flowing upwards, the blood pools in the lower leg. This is called post-thrombotic syndrome, and can result in pain, swelling, discoloration, and sores on the leg. Once identified, a DVT can be treated. It can also be prevented in some circumstances. Once you have had a DVT, you may be at increased risk for a DVT in the future. CAUSES Blood clots form in a vein for different reasons. Usually several things contribute to blood clots. Contributing factors include:  The flow of blood slows down.  The inside of the vein is damaged in some way.  The person has a condition that makes blood clot more easily. Some people are more likely than others to develop blood clots. That is because they have more factors that make clots likely. These are called risk factors. Risk factors include:   Older age, especially over 75 years old.  Having a history of blood clots. This means you have had one before. Or, it means that someone else in your family has had blood clots. You may have a genetic tendency to form clots.  Having major or lengthy surgery. This is especially true for surgery on the hip, knee, or belly (abdomen). Hip surgery is particularly high risk.  Breaking a hip or leg.  Sitting or lying still for a long time. This includes long distance travel, paralysis, or recovery from an illness or surgery.  Cancer, or cancer treatment.  Having a long, thin tube (catheter) placed inside a vein during a medical procedure.  Being overweight (obese).  Pregnancy and childbirth. Hormone  changes make the blood clot more easily during pregnancy. The fetus puts pressure on the veins of the pelvis. There is also risk of injury to veins during delivery or a caesarean. The risk is at its highest just after childbirth.  Medicines with the male hormone estrogen. This includes birth control pills and hormone replacement therapy.  Smoking.  Other circulation or heart problems. SYMPTOMS When a clot forms, it can either partially or totally block the blood flow in that vein. Symptoms of a DVT can include:  Swelling of the leg or arm, especially if one side is much worse.  Warmth and redness of the leg or arm, especially if one side is much worse.  Pain in an arm or leg. If the clot is in the leg, symptoms may be more noticeable or worse when standing or walking. The symptoms of a DVT that has traveled to the lungs (pulmonary embolism, PE) usually start suddenly, and include:  Shortness of breath.  Coughing.  Coughing up blood or blood-tinged phlegm.  Chest pain. The chest pain is often worse with deep breaths.  Rapid heartbeat. Anyone with these symptoms should get emergency medical treatment right away. Call your local emergency services (911 in [place] if you have these symptoms. DIAGNOSIS If a DVT is suspected, your caregiver will take a full medical history and carry out a physical exam. Tests that also may be required include:  Blood tests, including studies of   the clotting properties of the blood.  Ultrasonography to see if you have clots in your legs or lungs.  X-rays to show the flow of blood when dye is injected into the veins (venography).  Studies of your lungs, if you have any chest symptoms. PREVENTION  Exercise the legs regularly. Take a brisk 30 minute walk every day.  Maintain a weight that is appropriate for your height.  Avoid sitting or lying in bed for long periods of time without moving your legs.  Women, particularly those over the age of 35,  should consider the risks and benefits of taking estrogen medicines, including birth control pills.  Do not smoke, especially if you take estrogen medicines.  Long distance travel can increase your risk of DVT. You should exercise your legs by walking or pumping the muscles every hour.  In-hospital prevention:  Many of the risk factors above relate to situations that exist with hospitalization, either for illness, injury, or elective surgery.  Your caregiver will assess you for the need for venous thromboembolism prophylaxis when you are admitted to the hospital. If you are having surgery, your surgeon will assess you the day of or day after surgery.  Prevention may include medical and nonmedical measures. TREATMENT Treatment for DVT helps prevent death and disability. The most common treatment for DVT is blood thinning (anticoagulant) medicine, which reduces the blood's tendency to clot. Anticoagulants can stop new blood clots from forming and old ones from growing. They cannot dissolve existing clots. Your body does this by itself over time. Anticoagulants can be given by mouth, by intravenous (IV) access, or by injection. Your caregiver will determine the best program for you.  Heparin or related medicines (low molecular weight heparin) are usually the first treatment for a blood clot. They act quickly. However, they cannot be taken orally.  Heparin can cause a fall in a component of blood that stops bleeding and forms blood clots (platelets). You will be monitored with blood tests to be sure this does not occur.  Warfarin is an anticoagulant that can be swallowed (taken orally). It takes a few days to start working, so usually heparin or related medicines are used in combination. Once warfarin is working, heparin is usually stopped.  Less commonly, clot dissolving drugs (thrombolytics) are used to dissolve a DVT. They carry a high risk of bleeding, so they are used mainly in severe cases,  where a life or limb is threatened.  Very rarely, a blood clot in the leg needs to be removed surgically.  If you are unable to take anticoagulants, your caregiver may arrange for you to have a filter placed in a main vein in your belly (abdomen). This filter prevents clots from traveling to your lungs. HOME CARE INSTRUCTIONS  Take all medicines prescribed by your caregiver. Follow the directions carefully.  Warfarin. Most people will continue taking warfarin after hospital discharge. Your caregiver will advise you on the length of treatment (usually 3 6 months, sometimes lifelong).  Too much and too little warfarin are both dangerous. Too much warfarin increases the risk of bleeding. Too little warfarin continues to allow the risk for blood clots. While taking warfarin, you will need to have regular blood tests to measure your blood clotting time. These blood tests usually include both the prothrombin time (PT) and international normalized ratio (INR) tests. The PT and INR results allow your caregiver to adjust your dose of warfarin. The dose can change for many reasons. It is critically important that   you take warfarin exactly as prescribed, and that you have your PT and INR levels drawn exactly as directed.  Many foods, especially foods high in vitamin K can interfere with warfarin and affect the PT and INR results. Foods high in vitamin K include spinach, kale, broccoli, cabbage, collard and turnip greens, brussels sprouts, peas, cauliflower, seaweed, and parsley as well as beef and pork liver, green tea, and soybean oil. You should eat a consistent amount of foods high in vitamin K. Avoid major changes in your diet, or notify your caregiver before changing your diet. Arrange a visit with a dietitian to answer your questions.  Many medicines can interfere with warfarin and affect the PT and INR results. You must tell your caregiver about any and all medicines you take, this includes all vitamins  and supplements. Be especially cautious with aspirin and anti-inflammatory medicines. Ask your caregiver before taking these. Do not take or discontinue any prescribed or over-the-counter medicine except on the advice of your caregiver or pharmacist.  Warfarin can have side effects, primarily excessive bruising or bleeding. You will need to hold pressure over cuts for longer than usual. Your caregiver or pharmacist will discuss other potential side effects.  Alcohol can change the body's ability to handle warfarin. It is best to avoid alcoholic drinks or consume only very small amounts while taking warfarin. Notify your caregiver if you change your alcohol intake.  Notify your dentist or other caregivers before procedures.  Activity. Ask your caregiver how soon you can go back to normal activities. It is important to stay active to prevent blood clots. If you are on anticoagulant medicine, avoid contact sports.  Exercise. It is very important to exercise. This is especially important while traveling, sitting or standing for long periods of time. Exercise your legs by walking or by pumping the muscles frequently. Take frequent walks.  Compression stockings. These are tight elastic stockings that apply pressure to the lower legs. This pressure can help keep the blood in the legs from clotting. You may need to wear compressions stockings at home to help prevent a DVT.  Smoking. If you smoke, quit. Ask your caregiver for help with quitting smoking.  Learn as much as you can about DVT. Knowing more about the condition should help you keep it from coming back.  Wear a medical alert bracelet or carry a medical alert card. SEEK MEDICAL CARE IF:  You notice a rapid heartbeat.  You feel weaker or more tired than usual.  You feel faint.  You notice increased bruising.  You feel your symptoms are not getting better in the time expected.  You believe you are having side effects of medicine. SEEK  IMMEDIATE MEDICAL CARE IF:  You have chest pain.  You have trouble breathing.  You have new or increased swelling or pain in one leg.  You cough up blood.  You notice blood in vomit, in a bowel movement, or in urine. MAKE SURE YOU:  Understand these instructions.  Will watch your condition.  Will get help right away if you are not doing well or get worse. Document Released: 08/19/2005 Document Revised: 05/13/2012 Document Reviewed: 10/11/2010 [employer] Patient Information 2014 [employer]  

## 2013-08-17 NOTE — Telephone Encounter (Signed)
Patient seen in office today, started on Xarelto 20 mg daily

## 2013-08-19 ENCOUNTER — Ambulatory Visit: Payer: 59 | Admitting: Physician Assistant

## 2013-08-22 ENCOUNTER — Encounter: Payer: Self-pay | Admitting: Family Medicine

## 2013-08-22 DIAGNOSIS — Z86718 Personal history of other venous thrombosis and embolism: Secondary | ICD-10-CM | POA: Insufficient documentation

## 2013-08-22 DIAGNOSIS — I82409 Acute embolism and thrombosis of unspecified deep veins of unspecified lower extremity: Secondary | ICD-10-CM

## 2013-08-22 HISTORY — DX: Personal history of other venous thrombosis and embolism: Z86.718

## 2013-08-22 HISTORY — DX: Acute embolism and thrombosis of unspecified deep veins of unspecified lower extremity: I82.409

## 2013-08-22 NOTE — Assessment & Plan Note (Signed)
Will need to maintain activity and attempt weight loss

## 2013-08-22 NOTE — Assessment & Plan Note (Signed)
Has had calf pain for 2 weeks. Ultrasound today confirmed DVT. Started on Xarelto 20 mg daily, warned to monitor for GI bleed.
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
## 2013-08-27 ENCOUNTER — Telehealth: Payer: Self-pay

## 2013-08-27 DIAGNOSIS — I82402 Acute embolism and thrombosis of unspecified deep veins of left lower extremity: Secondary | ICD-10-CM

## 2013-08-27 MED ORDER — RIVAROXABAN 20 MG PO TABS: 20.0000 mg | ORAL_TABLET | Freq: Every day | ORAL | Status: DC

## 2013-08-27 NOTE — Telephone Encounter (Signed)
pts spouse called stating pt needs Xarelto sent to the pharmacy

## 2013-10-22 ENCOUNTER — Telehealth: Payer: Self-pay | Admitting: *Deleted

## 2013-10-22 NOTE — Telephone Encounter (Signed)
Per Dr. Abner GreenspanBlyth VO, pt given #25 Samples and will check with Insurance on Eliquis [if on formulary list] before changing to Coumadin, as to avoid PT/INR checks, if possible/SLS

## 2013-10-22 NOTE — Telephone Encounter (Signed)
Please advise on request to have a more costwise alternative to Xarelto; pt is aware that he will have to have PT/INR checks with Coumadin, is Eliquis an option.?/SLS Please Advise.

## 2013-12-06 ENCOUNTER — Ambulatory Visit (INDEPENDENT_AMBULATORY_CARE_PROVIDER_SITE_OTHER): Payer: Medicaid Other | Admitting: Family Medicine

## 2013-12-06 ENCOUNTER — Telehealth: Payer: Self-pay | Admitting: Family Medicine

## 2013-12-06 ENCOUNTER — Encounter: Payer: Self-pay | Admitting: Family Medicine

## 2013-12-06 VITALS — BP 132/82 | HR 96 | Temp 98.4°F | Resp 18 | Ht 70.0 in | Wt 299.0 lb

## 2013-12-06 DIAGNOSIS — E785 Hyperlipidemia, unspecified: Secondary | ICD-10-CM

## 2013-12-06 DIAGNOSIS — R7989 Other specified abnormal findings of blood chemistry: Secondary | ICD-10-CM

## 2013-12-06 DIAGNOSIS — I82409 Acute embolism and thrombosis of unspecified deep veins of unspecified lower extremity: Secondary | ICD-10-CM

## 2013-12-06 DIAGNOSIS — R7309 Other abnormal glucose: Secondary | ICD-10-CM

## 2013-12-06 DIAGNOSIS — R739 Hyperglycemia, unspecified: Secondary | ICD-10-CM

## 2013-12-06 DIAGNOSIS — E781 Pure hyperglyceridemia: Secondary | ICD-10-CM

## 2013-12-06 DIAGNOSIS — Z0000 Encounter for general adult medical examination without abnormal findings: Secondary | ICD-10-CM

## 2013-12-06 DIAGNOSIS — I10 Essential (primary) hypertension: Secondary | ICD-10-CM

## 2013-12-06 LAB — LIPID PANEL
Cholesterol: 179 mg/dL (ref 0–200)
HDL: 39 mg/dL — ABNORMAL LOW (ref >39)
LDL Cholesterol: 103 mg/dL — ABNORMAL HIGH (ref 0–99)
Total CHOL/HDL Ratio: 4.6 Ratio
Triglycerides: 187 mg/dL — ABNORMAL HIGH (ref <150)
VLDL: 37 mg/dL (ref 0–40)

## 2013-12-06 LAB — RENAL FUNCTION PANEL
ALBUMIN: 4.1 g/dL (ref 3.5–5.2)
BUN: 10 mg/dL (ref 6–23)
CALCIUM: 9 mg/dL (ref 8.4–10.5)
CO2: 29 mEq/L (ref 19–32)
Chloride: 103 mEq/L (ref 96–112)
Creat: 1.03 mg/dL (ref 0.50–1.35)
GLUCOSE: 105 mg/dL — AB (ref 70–99)
Phosphorus: 3.8 mg/dL (ref 2.3–4.6)
Potassium: 4.8 mEq/L (ref 3.5–5.3)
Sodium: 139 mEq/L (ref 135–145)

## 2013-12-06 LAB — HEPATIC FUNCTION PANEL
ALBUMIN: 4.1 g/dL (ref 3.5–5.2)
ALK PHOS: 71 U/L (ref 39–117)
ALT: 29 U/L (ref 0–53)
AST: 22 U/L (ref 0–37)
Bilirubin, Direct: 0.1 mg/dL (ref 0.0–0.3)
Indirect Bilirubin: 0.4 mg/dL (ref 0.2–1.2)
TOTAL PROTEIN: 7.3 g/dL (ref 6.0–8.3)
Total Bilirubin: 0.5 mg/dL (ref 0.2–1.2)

## 2013-12-06 LAB — TSH: TSH: 3.819 u[IU]/mL (ref 0.350–4.500)

## 2013-12-06 LAB — CBC
HCT: 47.6 % (ref 39.0–52.0)
Hemoglobin: 15.7 g/dL (ref 13.0–17.0)
MCH: 28.1 pg (ref 26.0–34.0)
MCHC: 33 g/dL (ref 30.0–36.0)
MCV: 85.2 fL (ref 78.0–100.0)
Platelets: 260 10*3/uL (ref 150–400)
RBC: 5.59 MIL/uL (ref 4.22–5.81)
RDW: 14.1 % (ref 11.5–15.5)
WBC: 8.3 10*3/uL (ref 4.0–10.5)

## 2013-12-06 LAB — HEMOGLOBIN A1C
HEMOGLOBIN A1C: 6.1 % — AB (ref <5.7)
MEAN PLASMA GLUCOSE: 128 mg/dL — AB (ref <117)

## 2013-12-06 NOTE — Assessment & Plan Note (Signed)
small bowel obstruction 

## 2013-12-06 NOTE — Assessment & Plan Note (Signed)
hgba1c acceptable, minimize simple carbs. Increase exercise as tolerated. Recheck AiC, consider DASH diet

## 2013-12-06 NOTE — Assessment & Plan Note (Signed)
Encouraged heart healthy diet, increase exercise, avoid trans fats, consider a krill oil cap daily. Recheck lipids 

## 2013-12-06 NOTE — Telephone Encounter (Signed)
Lab order week of 7-20-Return in about 3 months (around 03/07/2014) for annual exam, lipid, renal, cbc, tsh, hepatic, hgba1c prior. 2015

## 2013-12-06 NOTE — Assessment & Plan Note (Signed)
Tolerating Xarelto well given samples, #25 tabs today, continue same for now.
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
## 2013-12-06 NOTE — Patient Instructions (Signed)
DASH Diet  The DASH diet stands for "Dietary Approaches to Stop Hypertension." It is a healthy eating plan that has been shown to reduce high blood pressure (hypertension) in as little as 14 days, while also possibly providing other significant health benefits. These other health benefits include reducing the risk of breast cancer after menopause and reducing the risk of type 2 diabetes, heart disease, colon cancer, and stroke. Health benefits also include weight loss and slowing kidney failure in patients with chronic kidney disease.   DIET GUIDELINES  · Limit salt (sodium). Your diet should contain less than 1500 mg of sodium daily.  · Limit refined or processed carbohydrates. Your diet should include mostly whole grains. Desserts and added sugars should be used sparingly.  · Include small amounts of heart-healthy fats. These types of fats include nuts, oils, and tub margarine. Limit saturated and trans fats. These fats have been shown to be harmful in the body.  CHOOSING FOODS   The following food groups are based on a 2000 calorie diet. See your Registered Dietitian for individual calorie needs.  Grains and Grain Products (6 to 8 servings daily)  · Eat More Often: Whole-wheat bread, brown rice, whole-grain or wheat pasta, quinoa, popcorn without added fat or salt (air popped).  · Eat Less Often: White bread, white pasta, white rice, cornbread.  Vegetables (4 to 5 servings daily)  · Eat More Often: Fresh, frozen, and canned vegetables. Vegetables may be raw, steamed, roasted, or grilled with a minimal amount of fat.  · Eat Less Often/Avoid: Creamed or fried vegetables. Vegetables in a cheese sauce.  Fruit (4 to 5 servings daily)  · Eat More Often: All fresh, canned (in natural juice), or frozen fruits. Dried fruits without added sugar. One hundred percent fruit juice (½ cup [237 mL] daily).  · Eat Less Often: Dried fruits with added sugar. Canned fruit in light or heavy syrup.  Lean Meats, Fish, and Poultry (2  servings or less daily. One serving is 3 to 4 oz [85-114 g]).  · Eat More Often: Ninety percent or leaner ground beef, tenderloin, sirloin. Round cuts of beef, chicken breast, turkey breast. All fish. Grill, bake, or broil your meat. Nothing should be fried.  · Eat Less Often/Avoid: Fatty cuts of meat, turkey, or chicken leg, thigh, or wing. Fried cuts of meat or fish.  Dairy (2 to 3 servings)  · Eat More Often: Low-fat or fat-free milk, low-fat plain or light yogurt, reduced-fat or part-skim cheese.  · Eat Less Often/Avoid: Milk (whole, 2%). Whole milk yogurt. Full-fat cheeses.  Nuts, Seeds, and Legumes (4 to 5 servings per week)  · Eat More Often: All without added salt.  · Eat Less Often/Avoid: Salted nuts and seeds, canned beans with added salt.  Fats and Sweets (limited)  · Eat More Often: Vegetable oils, tub margarines without trans fats, sugar-free gelatin. Mayonnaise and salad dressings.  · Eat Less Often/Avoid: Coconut oils, palm oils, butter, stick margarine, cream, half and half, cookies, candy, pie.  FOR MORE INFORMATION  The Dash Diet Eating Plan: www.dashdiet.org  Document Released: 08/08/2011 Document Revised: 11/11/2011 Document Reviewed: 08/08/2011  ExitCare® Patient Information ©2014 [employer].

## 2013-12-07 ENCOUNTER — Telehealth: Payer: Self-pay | Admitting: Family Medicine

## 2013-12-07 NOTE — Telephone Encounter (Signed)
Relevant patient education mailed to patient.  

## 2013-12-13 NOTE — Telephone Encounter (Signed)
Lab order placed.

## 2014-03-24 LAB — RENAL FUNCTION PANEL
ALBUMIN: 3.8 g/dL (ref 3.5–5.2)
BUN: 13 mg/dL (ref 6–23)
CO2: 26 meq/L (ref 19–32)
CREATININE: 1.06 mg/dL (ref 0.50–1.35)
Calcium: 8.8 mg/dL (ref 8.4–10.5)
Chloride: 102 mEq/L (ref 96–112)
GLUCOSE: 101 mg/dL — AB (ref 70–99)
Phosphorus: 3.7 mg/dL (ref 2.3–4.6)
Potassium: 4.4 mEq/L (ref 3.5–5.3)
SODIUM: 137 meq/L (ref 135–145)

## 2014-03-24 LAB — CBC
HEMATOCRIT: 45 % (ref 39.0–52.0)
Hemoglobin: 15.4 g/dL (ref 13.0–17.0)
MCH: 28.4 pg (ref 26.0–34.0)
MCHC: 34.2 g/dL (ref 30.0–36.0)
MCV: 83 fL (ref 78.0–100.0)
PLATELETS: 276 10*3/uL (ref 150–400)
RBC: 5.42 MIL/uL (ref 4.22–5.81)
RDW: 13.8 % (ref 11.5–15.5)
WBC: 8.4 10*3/uL (ref 4.0–10.5)

## 2014-03-24 LAB — HEMOGLOBIN A1C
Hgb A1c MFr Bld: 6.1 % — ABNORMAL HIGH (ref <5.7)
MEAN PLASMA GLUCOSE: 128 mg/dL — AB (ref <117)

## 2014-03-24 LAB — LIPID PANEL
Cholesterol: 178 mg/dL (ref 0–200)
HDL: 39 mg/dL — ABNORMAL LOW (ref >39)
LDL Cholesterol: 100 mg/dL — ABNORMAL HIGH (ref 0–99)
TRIGLYCERIDES: 194 mg/dL — AB (ref <150)
Total CHOL/HDL Ratio: 4.6 Ratio
VLDL: 39 mg/dL (ref 0–40)

## 2014-03-24 LAB — HEPATIC FUNCTION PANEL
ALBUMIN: 3.8 g/dL (ref 3.5–5.2)
ALK PHOS: 70 U/L (ref 39–117)
ALT: 23 U/L (ref 0–53)
AST: 19 U/L (ref 0–37)
BILIRUBIN TOTAL: 0.5 mg/dL (ref 0.2–1.2)
Bilirubin, Direct: 0.1 mg/dL (ref 0.0–0.3)
Indirect Bilirubin: 0.4 mg/dL (ref 0.2–1.2)
Total Protein: 7.1 g/dL (ref 6.0–8.3)

## 2014-03-24 LAB — TSH: TSH: 3.115 u[IU]/mL (ref 0.350–4.500)

## 2014-03-31 ENCOUNTER — Ambulatory Visit (INDEPENDENT_AMBULATORY_CARE_PROVIDER_SITE_OTHER): Payer: Medicaid Other | Admitting: Family Medicine

## 2014-03-31 ENCOUNTER — Encounter: Payer: Self-pay | Admitting: Family Medicine

## 2014-03-31 DIAGNOSIS — I82409 Acute embolism and thrombosis of unspecified deep veins of unspecified lower extremity: Secondary | ICD-10-CM

## 2014-03-31 DIAGNOSIS — Z0000 Encounter for general adult medical examination without abnormal findings: Secondary | ICD-10-CM

## 2014-03-31 DIAGNOSIS — E785 Hyperlipidemia, unspecified: Secondary | ICD-10-CM

## 2014-03-31 DIAGNOSIS — R7309 Other abnormal glucose: Secondary | ICD-10-CM

## 2014-03-31 NOTE — Patient Instructions (Signed)
DASH Eating Plan °DASH stands for "Dietary Approaches to Stop Hypertension." The DASH eating plan is a healthy eating plan that has been shown to reduce high blood pressure (hypertension). Additional health benefits may include reducing the risk of type 2 diabetes mellitus, heart disease, and stroke. The DASH eating plan may also help with weight loss. °WHAT DO I NEED TO KNOW ABOUT THE DASH EATING PLAN? °For the DASH eating plan, you will follow these general guidelines: °· Choose foods with a percent daily value for sodium of less than 5% (as listed on the food label). °· Use salt-free seasonings or herbs instead of table salt or sea salt. °· Check with your health care provider or pharmacist before using salt substitutes. °· Eat lower-sodium products, often labeled as "lower sodium" or "no salt added." °· Eat fresh foods. °· Eat more vegetables, fruits, and low-fat dairy products. °· Choose whole grains. Look for the word "whole" as the first word in the ingredient list. °· Choose fish and skinless chicken or turkey more often than red meat. Limit fish, poultry, and meat to 6 oz (170 g) each day. °· Limit sweets, desserts, sugars, and sugary drinks. °· Choose heart-healthy fats. °· Limit cheese to 1 oz (28 g) per day. °· Eat more home-cooked food and less restaurant, buffet, and fast food. °· Limit fried foods. °· Cook foods using methods other than frying. °· Limit canned vegetables. If you do use them, rinse them well to decrease the sodium. °· When eating at a restaurant, ask that your food be prepared with less salt, or no salt if possible. °WHAT FOODS CAN I EAT? °Seek help from a dietitian for individual calorie needs. °Grains °Whole grain or whole wheat bread. Brown rice. Whole grain or whole wheat pasta. Quinoa, bulgur, and whole grain cereals. Low-sodium cereals. Corn or whole wheat flour tortillas. Whole grain cornbread. Whole grain crackers. Low-sodium crackers. °Vegetables °Fresh or frozen vegetables  (raw, steamed, roasted, or grilled). Low-sodium or reduced-sodium tomato and vegetable juices. Low-sodium or reduced-sodium tomato sauce and paste. Low-sodium or reduced-sodium canned vegetables.  °Fruits °All fresh, canned (in natural juice), or frozen fruits. °Meat and Other Protein Products °Ground beef (85% or leaner), grass-fed beef, or beef trimmed of fat. Skinless chicken or turkey. Ground chicken or turkey. Pork trimmed of fat. All fish and seafood. Eggs. Dried beans, peas, or lentils. Unsalted nuts and seeds. Unsalted canned beans. °Dairy °Low-fat dairy products, such as skim or 1% milk, 2% or reduced-fat cheeses, low-fat ricotta or cottage cheese, or plain low-fat yogurt. Low-sodium or reduced-sodium cheeses. °Fats and Oils °Tub margarines without trans fats. Light or reduced-fat mayonnaise and salad dressings (reduced sodium). Avocado. Safflower, olive, or canola oils. Natural peanut or almond butter. °Other °Unsalted popcorn and pretzels. °The items listed above may not be a complete list of recommended foods or beverages. Contact your dietitian for more options. °WHAT FOODS ARE NOT RECOMMENDED? °Grains °White bread. White pasta. White rice. Refined cornbread. Bagels and croissants. Crackers that contain trans fat. °Vegetables °Creamed or fried vegetables. Vegetables in a cheese sauce. Regular canned vegetables. Regular canned tomato sauce and paste. Regular tomato and vegetable juices. °Fruits °Dried fruits. Canned fruit in light or heavy syrup. Fruit juice. °Meat and Other Protein Products °Fatty cuts of meat. Ribs, chicken wings, bacon, sausage, bologna, salami, chitterlings, fatback, hot dogs, bratwurst, and packaged luncheon meats. Salted nuts and seeds. Canned beans with salt. °Dairy °Whole or 2% milk, cream, half-and-half, and cream cheese. Whole-fat or sweetened yogurt. Full-fat   cheeses or blue cheese. Nondairy creamers and whipped toppings. Processed cheese, cheese spreads, or cheese  curds. °Condiments °Onion and garlic salt, seasoned salt, table salt, and sea salt. Canned and packaged gravies. Worcestershire sauce. Tartar sauce. Barbecue sauce. Teriyaki sauce. Soy sauce, including reduced sodium. Steak sauce. Fish sauce. Oyster sauce. Cocktail sauce. Horseradish. Ketchup and mustard. Meat flavorings and tenderizers. Bouillon cubes. Hot sauce. Tabasco sauce. Marinades. Taco seasonings. Relishes. °Fats and Oils °Butter, stick margarine, lard, shortening, ghee, and bacon fat. Coconut, palm kernel, or palm oils. Regular salad dressings. °Other °Pickles and olives. Salted popcorn and pretzels. °The items listed above may not be a complete list of foods and beverages to avoid. Contact your dietitian for more information. °WHERE CAN I FIND MORE INFORMATION? °[employer], [employer], [employer]: www.nhlbi.nih.gov/health/health-topics/topics/dash/ °Document Released: 08/08/2011 Document Revised: 01/03/2014 Document Reviewed: 06/23/2013 °ExitCare® Patient Information ©2015 [employer]. This information is not intended to replace advice given to you by your health care provider. Make sure you discuss any questions you have with your health care provider. ° °

## 2014-04-04 ENCOUNTER — Encounter: Payer: Self-pay | Admitting: Family Medicine

## 2014-04-04 DIAGNOSIS — Z0000 Encounter for general adult medical examination without abnormal findings: Secondary | ICD-10-CM

## 2014-04-04 HISTORY — DX: Encounter for general adult medical examination without abnormal findings: Z00.00
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
## 2014-04-04 NOTE — Assessment & Plan Note (Signed)
Encouraged DASH diet, decrease po intake and increase exercise as tolerated. Needs 7-8 hours of sleep nightly. Avoid trans fats, eat small, frequent meals every 4-5 hours with lean proteins, complex carbs and healthy fats. Minimize simple carbs, GMO foods. 

## 2014-04-04 NOTE — Assessment & Plan Note (Signed)
No recurrence, Xarelto has been stopped.

## 2014-04-04 NOTE — Assessment & Plan Note (Signed)
hgba1c acceptable, minimize simple carbs. Increase exercise as tolerated.  

## 2014-04-04 NOTE — Assessment & Plan Note (Signed)
Encouraged heart healthy diet, increase exercise, avoid trans fats, consider a krill oil cap daily 

## 2014-04-04 NOTE — Assessment & Plan Note (Signed)
Patient encouraged to maintain heart healthy diet, regular exercise, adequate sleep. Consider daily probiotics. Take medications as prescribed 

## 2014-04-29 ENCOUNTER — Telehealth: Payer: Self-pay

## 2014-04-29 ENCOUNTER — Ambulatory Visit: Payer: Medicaid Other | Admitting: Family Medicine

## 2014-04-29 ENCOUNTER — Ambulatory Visit (INDEPENDENT_AMBULATORY_CARE_PROVIDER_SITE_OTHER): Payer: Medicaid Other | Admitting: Nurse Practitioner

## 2014-04-29 ENCOUNTER — Encounter: Payer: Self-pay | Admitting: Nurse Practitioner

## 2014-04-29 VITALS — BP 138/89 | HR 84 | Temp 97.0°F | Resp 18 | Ht 70.0 in | Wt 295.0 lb

## 2014-04-29 DIAGNOSIS — IMO0002 Reserved for concepts with insufficient information to code with codable children: Secondary | ICD-10-CM

## 2014-04-29 DIAGNOSIS — S29011A Strain of muscle and tendon of front wall of thorax, initial encounter: Secondary | ICD-10-CM

## 2014-04-29 NOTE — Telephone Encounter (Signed)
Patient scheduled appointment for today

## 2014-04-29 NOTE — Patient Instructions (Signed)
Your symptoms are consistent with muscle strain. Probably occurred with strenuous coughing. Pain should resolved within a week. Take tylenol or advil if needed.

## 2014-04-29 NOTE — Progress Notes (Signed)
   Subjective:    Patient ID: Ross Spencer, male    DOB: [birth year], 48 y.o.   MRN: [id number]  Chest Pain  This is a new problem. The current episode started yesterday (noticed when leaned against bar. recalls strenuous coughing & gagging a few days ago). The onset quality is sudden. Episode frequency: only hurts if pusches against xiphoid process. The problem has been unchanged. The pain is present in the substernal region. The pain is mild. The quality of the pain is described as dull (feels like punched). The pain does not radiate. Associated symptoms include a cough (1 episode coughing 2 days ago). Pertinent negatives include no abdominal pain, back pain, diaphoresis, dizziness, exertional chest pressure, fever, headaches, irregular heartbeat, leg pain, lower extremity edema, malaise/fatigue, nausea, near-syncope, shortness of breath, vomiting or weakness.  His past medical history is significant for DVT.      Review of Systems  Constitutional: Negative for fever, malaise/fatigue and diaphoresis.  Respiratory: Positive for cough (1 episode coughing 2 days ago). Negative for shortness of breath.   Cardiovascular: Positive for chest pain. Negative for near-syncope.  Gastrointestinal: Negative for nausea, vomiting and abdominal pain.  Musculoskeletal: Negative for back pain.  Neurological: Negative for dizziness, weakness and headaches.       Objective:   Physical Exam  Vitals reviewed. Constitutional: He is oriented to person, place, and time. He appears well-developed and well-nourished. No distress.  HENT:  Head: Normocephalic and atraumatic.  Eyes: Conjunctivae are normal. Right eye exhibits no discharge. Left eye exhibits no discharge.  Cardiovascular: Normal rate, regular rhythm and normal heart sounds.   No murmur heard. Pulmonary/Chest: Effort normal and breath sounds normal. No respiratory distress. He has no wheezes. He has no rales. He exhibits tenderness (tender at  xiphoid process).  Musculoskeletal: He exhibits tenderness.  Does not notice pain until pushes over xyphoid process or flexes pectoral muscles.  Neurological: He is alert and oriented to person, place, and time.  Skin: Skin is warm and dry.  Psychiatric: He has a normal mood and affect. His behavior is normal. Thought content normal.          Assessment & Plan:  1. Muscle strain of chest wall, initial encounter Tylenol or ibuprophen PRN F/u PRN

## 2014-04-29 NOTE — Telephone Encounter (Signed)
Pt stated that "he woke up with a lump in his chest below his breast plate and he wants to come in to the office to be seen." LDM

## 2014-04-29 NOTE — Telephone Encounter (Signed)
If he can get in this am I will see him otherwise he could go to urgent care anywhere or we could set him up with Saturday urgent care.

## 2014-04-29 NOTE — Progress Notes (Signed)
Pre visit review using our clinic review tool, if applicable. No additional management support is needed unless otherwise documented below in the visit note. 

## 2014-06-19 ENCOUNTER — Emergency Department (HOSPITAL_BASED_OUTPATIENT_CLINIC_OR_DEPARTMENT_OTHER)
Admission: EM | Admit: 2014-06-19 | Discharge: 2014-06-19 | Disposition: A | Discharge status: Home / Self Care | Payer: Medicaid Other | Attending: Emergency Medicine | Admitting: Emergency Medicine

## 2014-06-19 ENCOUNTER — Emergency Department (HOSPITAL_BASED_OUTPATIENT_CLINIC_OR_DEPARTMENT_OTHER): Payer: Medicaid Other

## 2014-06-19 ENCOUNTER — Encounter (HOSPITAL_BASED_OUTPATIENT_CLINIC_OR_DEPARTMENT_OTHER): Payer: Self-pay | Admitting: Emergency Medicine

## 2014-06-19 DIAGNOSIS — I8001 Phlebitis and thrombophlebitis of superficial vessels of right lower extremity: Secondary | ICD-10-CM | POA: Insufficient documentation

## 2014-06-19 DIAGNOSIS — I809 Phlebitis and thrombophlebitis of unspecified site: Secondary | ICD-10-CM

## 2014-06-19 DIAGNOSIS — Z86718 Personal history of other venous thrombosis and embolism: Secondary | ICD-10-CM | POA: Insufficient documentation

## 2014-06-19 DIAGNOSIS — E669 Obesity, unspecified: Secondary | ICD-10-CM | POA: Insufficient documentation

## 2014-06-19 DIAGNOSIS — R52 Pain, unspecified: Secondary | ICD-10-CM

## 2014-06-19 MED ORDER — IBUPROFEN 600 MG PO TABS: 600.0000 mg | ORAL_TABLET | Freq: Four times a day (QID) | ORAL | Status: DC | PRN

## 2014-06-19 NOTE — Discharge Instructions (Signed)
Phlebitis °Phlebitis is soreness and swelling (inflammation) of a vein. This can occur in your arms, legs, or torso (trunk), as well as deeper inside your body. Phlebitis is usually not serious when it occurs close to the surface of the body. However, it can cause serious problems when it occurs in a vein deeper inside the body. °CAUSES  °Phlebitis can be triggered by various things, including:  °· Reduced blood flow through your veins. This can happen with: °¨ Bed rest over a long period. °¨ Long-distance travel. °¨ Injury. °¨ Surgery. °¨ Being overweight (obese) or pregnant. °· Having an IV tube put in the vein and getting certain medicines through the vein. °· Cancer and cancer treatment. °· Use of illegal drugs taken through the vein. °· Inflammatory diseases. °· Inherited (genetic) diseases that increase the risk of blood clots. °· Hormone therapy, such as birth control pills. °SIGNS AND SYMPTOMS  °· Red, tender, swollen, and painful area on your skin. Usually, the area will be long and narrow. °· Firmness along the center of the affected area. This can indicate that a blood clot has formed. °· Low-grade fever. °DIAGNOSIS  °A health care provider can usually diagnose phlebitis by examining the affected area and asking about your symptoms. To check for infection or blood clots, your health care provider may order blood tests or an ultrasound exam of the area. Blood tests and your family history may also indicate if you have an underlying genetic disease that causes blood clots. Occasionally, a piece of tissue is taken from the body (biopsy sample) if an unusual cause of phlebitis is suspected. °TREATMENT  °Treatment will vary depending on the severity of the condition and the area of the body affected. Treatment may include: °· Use of a warm compress or heating pad. °· Use of compression stockings or bandages. °· Anti-inflammatory medicines. °· Removal of any IV tube that may be causing the problem. °· Medicines  that kill germs (antibiotics) if an infection is present. °· Blood-thinning medicines if a blood clot is suspected or present. °· In rare cases, surgery may be needed to remove damaged sections of vein. °HOME CARE INSTRUCTIONS  °· Only take over-the-counter or prescription medicines as directed by your health care provider. Take all medicines exactly as prescribed. °· Raise (elevate) the affected area above the level of your heart as directed by your health care provider. °· Apply a warm compress or heating pad to the affected area as directed by your health care provider. Do not sleep with the heating pad. °· Use compression stockings or bandages as directed. These will speed healing and prevent the condition from coming back. °· If you are on blood thinners: °¨ Get follow-up blood tests as directed by your health care provider. °¨ Check with your health care provider before using any new medicines. °¨ Carry a medical alert card or wear your medical alert jewelry to show that you are on blood thinners. °· For phlebitis in the legs: °¨ Avoid prolonged standing or bed rest. °¨ Keep your legs moving. Raise your legs when sitting or lying. °· Do not smoke. °· Women, particularly those over the age of 35, should consider the risks and benefits of taking the contraceptive pill. This kind of hormone treatment can increase your risk for blood clots. °· Follow up with your health care provider as directed. °SEEK MEDICAL CARE IF:  °· You have unusual bruising or any bleeding problems. °· Your swelling or pain in the affected area   is not improving. °· You are on anti-inflammatory medicine, and you develop belly (abdominal) pain. °SEEK IMMEDIATE MEDICAL CARE IF:  °· You have a sudden onset of chest pain or difficulty breathing. °· You have a fever or persistent symptoms for more than 2-3 days. °· You have a fever and your symptoms suddenly get worse. °MAKE SURE YOU: °· Understand these instructions. °· Will watch your  condition. °· Will get help right away if you are not doing well or get worse. °Document Released: 08/13/2001 Document Revised: 06/09/2013 Document Reviewed: 04/26/2013 °ExitCare® Patient Information ©2015 [employer]. This information is not intended to replace advice given to you by your health care provider. Make sure you discuss any questions you have with your health care provider. ° °

## 2014-06-19 NOTE — ED Notes (Addendum)
PT presents to ED with complaints of right lower leg pain warmth and swelling. PT recently had DVT in right lower leg.
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
## 2014-09-30 ENCOUNTER — Encounter: Payer: Self-pay | Admitting: Family Medicine

## 2014-09-30 ENCOUNTER — Ambulatory Visit (INDEPENDENT_AMBULATORY_CARE_PROVIDER_SITE_OTHER): Payer: Self-pay | Admitting: Family Medicine

## 2014-09-30 VITALS — BP 148/84 | HR 83 | Temp 98.0°F | Ht 70.0 in | Wt 300.6 lb

## 2014-09-30 DIAGNOSIS — R7989 Other specified abnormal findings of blood chemistry: Secondary | ICD-10-CM

## 2014-09-30 DIAGNOSIS — I10 Essential (primary) hypertension: Secondary | ICD-10-CM

## 2014-09-30 DIAGNOSIS — R739 Hyperglycemia, unspecified: Secondary | ICD-10-CM

## 2014-09-30 DIAGNOSIS — J342 Deviated nasal septum: Secondary | ICD-10-CM

## 2014-09-30 DIAGNOSIS — E119 Type 2 diabetes mellitus without complications: Secondary | ICD-10-CM | POA: Insufficient documentation

## 2014-09-30 DIAGNOSIS — Z23 Encounter for immunization: Secondary | ICD-10-CM

## 2014-09-30 DIAGNOSIS — E781 Pure hyperglyceridemia: Secondary | ICD-10-CM

## 2014-09-30 DIAGNOSIS — E782 Mixed hyperlipidemia: Secondary | ICD-10-CM

## 2014-09-30 DIAGNOSIS — E1169 Type 2 diabetes mellitus with other specified complication: Secondary | ICD-10-CM | POA: Insufficient documentation

## 2014-09-30 DIAGNOSIS — R946 Abnormal results of thyroid function studies: Secondary | ICD-10-CM

## 2014-09-30 HISTORY — DX: Hyperglycemia, unspecified: R73.9

## 2014-09-30 LAB — LIPID PANEL
CHOL/HDL RATIO: 5
Cholesterol: 179 mg/dL (ref 0–200)
HDL: 39 mg/dL — ABNORMAL LOW (ref >39.00)
NonHDL: 140
Triglycerides: 260 mg/dL — ABNORMAL HIGH (ref 0.0–149.0)
VLDL: 52 mg/dL — ABNORMAL HIGH (ref 0.0–40.0)

## 2014-09-30 LAB — CBC WITH DIFFERENTIAL/PLATELET
Basophils Absolute: 0.1 10*3/uL (ref 0.0–0.1)
Basophils Relative: 0.6 % (ref 0.0–3.0)
EOS PCT: 1.8 % (ref 0.0–5.0)
Eosinophils Absolute: 0.2 10*3/uL (ref 0.0–0.7)
HCT: 47.4 % (ref 39.0–52.0)
Hemoglobin: 15.8 g/dL (ref 13.0–17.0)
LYMPHS PCT: 40.1 % (ref 12.0–46.0)
Lymphs Abs: 3.4 10*3/uL (ref 0.7–4.0)
MCHC: 33.3 g/dL (ref 30.0–36.0)
MCV: 85.5 fl (ref 78.0–100.0)
MONOS PCT: 9 % (ref 3.0–12.0)
Monocytes Absolute: 0.8 10*3/uL (ref 0.1–1.0)
NEUTROS ABS: 4.1 10*3/uL (ref 1.4–7.7)
Neutrophils Relative %: 48.5 % (ref 43.0–77.0)
Platelets: 245 10*3/uL (ref 150.0–400.0)
RBC: 5.55 Mil/uL (ref 4.22–5.81)
RDW: 13.9 % (ref 11.5–15.5)
WBC: 8.5 10*3/uL (ref 4.0–10.5)

## 2014-09-30 LAB — COMPREHENSIVE METABOLIC PANEL
ALK PHOS: 82 U/L (ref 39–117)
ALT: 31 U/L (ref 0–53)
AST: 23 U/L (ref 0–37)
Albumin: 4 g/dL (ref 3.5–5.2)
BUN: 11 mg/dL (ref 6–23)
CALCIUM: 9.1 mg/dL (ref 8.4–10.5)
CHLORIDE: 104 meq/L (ref 96–112)
CO2: 26 mEq/L (ref 19–32)
Creatinine, Ser: 1.02 mg/dL (ref 0.40–1.50)
GFR: 100.35 mL/min (ref >60.00)
Glucose, Bld: 102 mg/dL — ABNORMAL HIGH (ref 70–99)
Potassium: 4.1 mEq/L (ref 3.5–5.1)
Sodium: 136 mEq/L (ref 135–145)
Total Bilirubin: 0.5 mg/dL (ref 0.2–1.2)
Total Protein: 7.7 g/dL (ref 6.0–8.3)

## 2014-09-30 LAB — HEMOGLOBIN A1C: Hgb A1c MFr Bld: 6.3 % (ref 4.6–6.5)

## 2014-09-30 LAB — TSH: TSH: 2.83 u[IU]/mL (ref 0.35–4.50)

## 2014-09-30 LAB — LDL CHOLESTEROL, DIRECT: LDL DIRECT: 90 mg/dL

## 2014-09-30 NOTE — Assessment & Plan Note (Signed)
hgba1c acceptable, minimize simple carbs. Increase exercise as tolerated. Check A1C today

## 2014-09-30 NOTE — Patient Instructions (Signed)

## 2014-09-30 NOTE — Progress Notes (Signed)
Pre visit review using our clinic review tool, if applicable. No additional management support is needed unless otherwise documented below in the visit note. 

## 2014-10-09 NOTE — Assessment & Plan Note (Signed)
Encouraged heart healthy diet, increase exercise, avoid trans fats, consider a krill oil cap daily 

## 2014-10-09 NOTE — Assessment & Plan Note (Signed)
hgba1c acceptable but increasing, minimize simple carbs. Increase exercise as tolerated.

## 2014-10-09 NOTE — Assessment & Plan Note (Signed)
Encouraged DASH diet, decrease po intake and increase exercise as tolerated. Needs 7-8 hours of sleep nightly. Avoid trans fats, eat small, frequent meals every 4-5 hours with lean proteins, complex carbs and healthy fats. Minimize simple carbs 
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
## 2015-01-31 ENCOUNTER — Ambulatory Visit: Payer: Medicaid Other | Admitting: Family Medicine

## 2015-02-07 ENCOUNTER — Telehealth: Payer: Self-pay | Admitting: Family Medicine

## 2015-02-07 ENCOUNTER — Encounter: Payer: Self-pay | Admitting: Family Medicine

## 2015-02-07 NOTE — Telephone Encounter (Signed)
Pt was no show for appt on 5/31. Letter sent- charge ?

## 2015-02-07 NOTE — Telephone Encounter (Signed)
OK to charge for missed visit

## 2015-04-03 ENCOUNTER — Encounter (HOSPITAL_BASED_OUTPATIENT_CLINIC_OR_DEPARTMENT_OTHER): Payer: Self-pay | Admitting: *Deleted

## 2015-04-03 ENCOUNTER — Emergency Department (HOSPITAL_BASED_OUTPATIENT_CLINIC_OR_DEPARTMENT_OTHER)
Admission: EM | Admit: 2015-04-03 | Discharge: 2015-04-03 | Disposition: A | Discharge status: Home / Self Care | Payer: Medicaid Other | Attending: Emergency Medicine | Admitting: Emergency Medicine

## 2015-04-03 DIAGNOSIS — R310 Gross hematuria: Secondary | ICD-10-CM

## 2015-04-03 DIAGNOSIS — Z86718 Personal history of other venous thrombosis and embolism: Secondary | ICD-10-CM | POA: Insufficient documentation

## 2015-04-03 DIAGNOSIS — E669 Obesity, unspecified: Secondary | ICD-10-CM | POA: Insufficient documentation

## 2015-04-03 LAB — I-STAT CHEM 8, ED
BUN: 13 mg/dL (ref 6–20)
Calcium, Ion: 1.21 mmol/L (ref 1.12–1.23)
Chloride: 99 mmol/L — ABNORMAL LOW (ref 101–111)
Creatinine, Ser: 1 mg/dL (ref 0.61–1.24)
GLUCOSE: 157 mg/dL — AB (ref 65–99)
HCT: 52 % (ref 39.0–52.0)
HEMOGLOBIN: 17.7 g/dL — AB (ref 13.0–17.0)
Potassium: 3.5 mmol/L (ref 3.5–5.1)
SODIUM: 141 mmol/L (ref 135–145)
TCO2: 25 mmol/L (ref 0–100)

## 2015-04-03 LAB — URINALYSIS, ROUTINE W REFLEX MICROSCOPIC
Bilirubin Urine: NEGATIVE
Glucose, UA: NEGATIVE mg/dL
Hgb urine dipstick: NEGATIVE
KETONES UR: NEGATIVE mg/dL
Leukocytes, UA: NEGATIVE
Nitrite: NEGATIVE
PROTEIN: NEGATIVE mg/dL
Specific Gravity, Urine: 1.029 (ref 1.005–1.030)
UROBILINOGEN UA: 0.2 mg/dL (ref 0.0–1.0)
pH: 5.5 (ref 5.0–8.0)
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
## 2015-04-03 NOTE — ED Notes (Signed)
Hematuria x 3 days. No pain.

## 2015-04-03 NOTE — Discharge Instructions (Signed)
Return to the ED with any concerns including fever/chills, not able to urinate, back pain, vomiting, decreased level of alertness/lethargy, or any other alarming symptoms

## 2015-04-05 LAB — URINE CULTURE: Culture: NO GROWTH

## 2015-12-12 ENCOUNTER — Telehealth: Payer: Self-pay | Admitting: Family Medicine

## 2015-12-12 DIAGNOSIS — R0683 Snoring: Secondary | ICD-10-CM

## 2015-12-12 NOTE — Telephone Encounter (Signed)
Chart reviewed, sleep study previously ordered in 2016. Sleep study reordered.

## 2015-12-12 NOTE — Telephone Encounter (Signed)
Caller name: [name] Relationship to patient: self Can be reached: [phone]  Reason for call: [facility] He states that he was referred previously but I did not see documentation. 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 He states he did not have insurance before and was not able to go.

## 2015-12-27 ENCOUNTER — Emergency Department (HOSPITAL_BASED_OUTPATIENT_CLINIC_OR_DEPARTMENT_OTHER): Payer: Managed Care, Other (non HMO)

## 2015-12-27 ENCOUNTER — Encounter (HOSPITAL_BASED_OUTPATIENT_CLINIC_OR_DEPARTMENT_OTHER): Payer: Self-pay | Admitting: *Deleted

## 2015-12-27 ENCOUNTER — Emergency Department (HOSPITAL_BASED_OUTPATIENT_CLINIC_OR_DEPARTMENT_OTHER)
Admission: EM | Admit: 2015-12-27 | Discharge: 2015-12-27 | Disposition: A | Discharge status: Home / Self Care | Payer: Managed Care, Other (non HMO) | Attending: Emergency Medicine | Admitting: Emergency Medicine

## 2015-12-27 DIAGNOSIS — Z8639 Personal history of other endocrine, nutritional and metabolic disease: Secondary | ICD-10-CM | POA: Diagnosis not present

## 2015-12-27 DIAGNOSIS — Y990 Civilian activity done for income or pay: Secondary | ICD-10-CM | POA: Insufficient documentation

## 2015-12-27 DIAGNOSIS — M25562 Pain in left knee: Secondary | ICD-10-CM

## 2015-12-27 DIAGNOSIS — S8992XA Unspecified injury of left lower leg, initial encounter: Secondary | ICD-10-CM | POA: Diagnosis present

## 2015-12-27 DIAGNOSIS — Y9389 Activity, other specified: Secondary | ICD-10-CM | POA: Insufficient documentation

## 2015-12-27 DIAGNOSIS — Z86718 Personal history of other venous thrombosis and embolism: Secondary | ICD-10-CM | POA: Diagnosis not present

## 2015-12-27 DIAGNOSIS — E669 Obesity, unspecified: Secondary | ICD-10-CM | POA: Insufficient documentation

## 2015-12-27 DIAGNOSIS — Y9289 Other specified places as the place of occurrence of the external cause: Secondary | ICD-10-CM | POA: Insufficient documentation

## 2015-12-27 DIAGNOSIS — X501XXA Overexertion from prolonged static or awkward postures, initial encounter: Secondary | ICD-10-CM | POA: Insufficient documentation

## 2015-12-27 MED ORDER — IBUPROFEN 600 MG PO TABS: 600.0000 mg | ORAL_TABLET | Freq: Four times a day (QID) | ORAL | Status: DC | PRN

## 2015-12-27 MED ORDER — KETOROLAC TROMETHAMINE 30 MG/ML IJ SOLN
30.0000 mg | Freq: Once | INTRAMUSCULAR | Status: AC
  Administered 2015-12-27: 30 mg via INTRAMUSCULAR
  Filled 2015-12-27: qty 1

## 2015-12-27 MED FILL — IBUPROFEN 600 MG TABLET: 600 | 8 days supply | Qty: 30 | Fill #0

## 2015-12-27 NOTE — ED Notes (Signed)
C/o left knee pain and swelling onset Monday  Ambulatory, slight swelling noted,  Denies injury

## 2015-12-27 NOTE — ED Notes (Signed)
Patient transported to Ultrasound 

## 2015-12-27 NOTE — ED Notes (Signed)
C/o left knee pain and swelling onset Monday,  Denies injury  Ambulatory without diff

## 2015-12-27 NOTE — Discharge Instructions (Signed)
You were seen and evaluated today for your knee pain. It does not appear infected at this time. Your x-ray was unremarkable. On your Doppler study for DVT it showed a very small blood clot in one of the veins by your ankle that was there on your previous study multiple years ago. You can take an aspirin daily for this issue. He can use ibuprofen and Tylenol for your knee pain. Continue to ice and elevate it. Follow up outpatient as he will likely need an MRI for possible evaluation of your meniscus as well as the ligaments of your knee. Return with fever, increased swelling of the knee, new redness.   Knee Pain Knee pain is a very common symptom and can have many causes. Knee pain often goes away when you follow your health care provider's instructions for relieving pain and discomfort at home. However, knee pain can develop into a condition that needs treatment. Some conditions may include:  Arthritis caused by wear and tear (osteoarthritis).  Arthritis caused by swelling and irritation (rheumatoid arthritis or gout).  A cyst or growth in your knee.  An infection in your knee joint.  An injury that will not heal.  Damage, swelling, or irritation of the tissues that support your knee (torn ligaments or tendinitis). If your knee pain continues, additional tests may be ordered to diagnose your condition. Tests may include X-rays or other imaging studies of your knee. You may also need to have fluid removed from your knee. Treatment for ongoing knee pain depends on the cause, but treatment may include:  Medicines to relieve pain or swelling.  Steroid injections in your knee.  Physical therapy.  Surgery. HOME CARE INSTRUCTIONS  Take medicines only as directed by your health care provider.  Rest your knee and keep it raised (elevated) while you are resting.  Do not do things that cause or worsen pain.  Avoid high-impact activities or exercises, such as running, jumping rope, or doing  jumping jacks.  Apply ice to the knee area:  Put ice in a plastic bag.  Place a towel between your skin and the bag.  Leave the ice on for 20 minutes, 2-3 times a day.  Ask your health care provider if you should wear an elastic knee support.  Keep a pillow under your knee when you sleep.  Lose weight if you are overweight. Extra weight can put pressure on your knee.  Do not use any tobacco products, including cigarettes, chewing tobacco, or electronic cigarettes. If you need help quitting, ask your health care provider. Smoking may slow the healing of any bone and joint problems that you may have. SEEK MEDICAL CARE IF:  Your knee pain continues, changes, or gets worse.  You have a fever along with knee pain.  Your knee buckles or locks up.  Your knee becomes more swollen. SEEK IMMEDIATE MEDICAL CARE IF:   Your knee joint feels hot to the touch.  You have chest pain or trouble breathing.   This information is not intended to replace advice given to you by your health care provider. Make sure you discuss any questions you have with your health care provider.   Document Released: 06/16/2007 Document Revised: 09/09/2014 Document Reviewed: 04/04/2014 Elsevier Interactive Patient Education [employer]2016 [employer]

## 2015-12-27 NOTE — ED Notes (Signed)
Back from [place].
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
## 2016-01-08 ENCOUNTER — Other Ambulatory Visit: Payer: Self-pay | Admitting: Orthopedic Surgery

## 2016-01-09 ENCOUNTER — Other Ambulatory Visit: Payer: Self-pay | Admitting: Orthopedic Surgery

## 2016-01-09 DIAGNOSIS — M25562 Pain in left knee: Secondary | ICD-10-CM

## 2016-01-13 ENCOUNTER — Ambulatory Visit
Admission: RE | Admit: 2016-01-13 | Discharge: 2016-01-13 | Disposition: A | Discharge status: Home / Self Care | Payer: Managed Care, Other (non HMO) | Source: Ambulatory Visit | Attending: Orthopedic Surgery | Admitting: Orthopedic Surgery

## 2016-01-13 DIAGNOSIS — M25562 Pain in left knee: Secondary | ICD-10-CM

## 2016-01-23 ENCOUNTER — Ambulatory Visit: Payer: Managed Care, Other (non HMO) | Admitting: Family Medicine

## 2016-02-06 ENCOUNTER — Ambulatory Visit (INDEPENDENT_AMBULATORY_CARE_PROVIDER_SITE_OTHER): Payer: Managed Care, Other (non HMO) | Admitting: Family Medicine

## 2016-02-06 ENCOUNTER — Encounter: Payer: Self-pay | Admitting: Family Medicine

## 2016-02-06 DIAGNOSIS — R7989 Other specified abnormal findings of blood chemistry: Secondary | ICD-10-CM

## 2016-02-06 DIAGNOSIS — E782 Mixed hyperlipidemia: Secondary | ICD-10-CM

## 2016-02-06 DIAGNOSIS — E781 Pure hyperglyceridemia: Secondary | ICD-10-CM | POA: Diagnosis not present

## 2016-02-06 DIAGNOSIS — R739 Hyperglycemia, unspecified: Secondary | ICD-10-CM | POA: Diagnosis not present

## 2016-02-06 DIAGNOSIS — R946 Abnormal results of thyroid function studies: Secondary | ICD-10-CM

## 2016-02-06 NOTE — Progress Notes (Signed)
Pre visit review using our clinic review tool, if applicable. No additional management support is needed unless otherwise documented below in the visit note. 

## 2016-02-06 NOTE — Patient Instructions (Signed)
DASH Eating Plan  DASH stands for "Dietary Approaches to Stop Hypertension." The DASH eating plan is a healthy eating plan that has been shown to reduce high blood pressure (hypertension). Additional health benefits may include reducing the risk of type 2 diabetes mellitus, heart disease, and stroke. The DASH eating plan may also help with weight loss.  WHAT DO I NEED TO KNOW ABOUT THE DASH EATING PLAN?  For the DASH eating plan, you will follow these general guidelines:  · Choose foods with a percent daily value for sodium of less than 5% (as listed on the food label).  · Use salt-free seasonings or herbs instead of table salt or sea salt.  · Check with your health care provider or pharmacist before using salt substitutes.  · Eat lower-sodium products, often labeled as "lower sodium" or "no salt added."  · Eat fresh foods.  · Eat more vegetables, fruits, and low-fat dairy products.  · Choose whole grains. Look for the word "whole" as the first word in the ingredient list.  · Choose fish and skinless chicken or turkey more often than red meat. Limit fish, poultry, and meat to 6 oz (170 g) each day.  · Limit sweets, desserts, sugars, and sugary drinks.  · Choose heart-healthy fats.  · Limit cheese to 1 oz (28 g) per day.  · Eat more home-cooked food and less restaurant, buffet, and fast food.  · Limit fried foods.  · Cook foods using methods other than frying.  · Limit canned vegetables. If you do use them, rinse them well to decrease the sodium.  · When eating at a restaurant, ask that your food be prepared with less salt, or no salt if possible.  WHAT FOODS CAN I EAT?  Seek help from a dietitian for individual calorie needs.  Grains  Whole grain or whole wheat bread. Brown rice. Whole grain or whole wheat pasta. Quinoa, bulgur, and whole grain cereals. Low-sodium cereals. Corn or whole wheat flour tortillas. Whole grain cornbread. Whole grain crackers. Low-sodium crackers.  Vegetables  Fresh or frozen vegetables  (raw, steamed, roasted, or grilled). Low-sodium or reduced-sodium tomato and vegetable juices. Low-sodium or reduced-sodium tomato sauce and paste. Low-sodium or reduced-sodium canned vegetables.   Fruits  All fresh, canned (in natural juice), or frozen fruits.  Meat and Other Protein Products  Ground beef (85% or leaner), grass-fed beef, or beef trimmed of fat. Skinless chicken or turkey. Ground chicken or turkey. Pork trimmed of fat. All fish and seafood. Eggs. Dried beans, peas, or lentils. Unsalted nuts and seeds. Unsalted canned beans.  Dairy  Low-fat dairy products, such as skim or 1% milk, 2% or reduced-fat cheeses, low-fat ricotta or cottage cheese, or plain low-fat yogurt. Low-sodium or reduced-sodium cheeses.  Fats and Oils  Tub margarines without trans fats. Light or reduced-fat mayonnaise and salad dressings (reduced sodium). Avocado. Safflower, olive, or canola oils. Natural peanut or almond butter.  Other  Unsalted popcorn and pretzels.  The items listed above may not be a complete list of recommended foods or beverages. Contact your dietitian for more options.  WHAT FOODS ARE NOT RECOMMENDED?  Grains  White bread. White pasta. White rice. Refined cornbread. Bagels and croissants. Crackers that contain trans fat.  Vegetables  Creamed or fried vegetables. Vegetables in a cheese sauce. Regular canned vegetables. Regular canned tomato sauce and paste. Regular tomato and vegetable juices.  Fruits  Dried fruits. Canned fruit in light or heavy syrup. Fruit juice.  Meat and Other Protein   Products  Fatty cuts of meat. Ribs, chicken wings, bacon, sausage, bologna, salami, chitterlings, fatback, hot dogs, bratwurst, and packaged luncheon meats. Salted nuts and seeds. Canned beans with salt.  Dairy  Whole or 2% milk, cream, half-and-half, and cream cheese. Whole-fat or sweetened yogurt. Full-fat cheeses or blue cheese. Nondairy creamers and whipped toppings. Processed cheese, cheese spreads, or cheese  curds.  Condiments  Onion and garlic salt, seasoned salt, table salt, and sea salt. Canned and packaged gravies. Worcestershire sauce. Tartar sauce. Barbecue sauce. Teriyaki sauce. Soy sauce, including reduced sodium. Steak sauce. Fish sauce. Oyster sauce. Cocktail sauce. Horseradish. Ketchup and mustard. Meat flavorings and tenderizers. Bouillon cubes. Hot sauce. Tabasco sauce. Marinades. Taco seasonings. Relishes.  Fats and Oils  Butter, stick margarine, lard, shortening, ghee, and bacon fat. Coconut, palm kernel, or palm oils. Regular salad dressings.  Other  Pickles and olives. Salted popcorn and pretzels.  The items listed above may not be a complete list of foods and beverages to avoid. Contact your dietitian for more information.  WHERE CAN I FIND MORE INFORMATION?  National Heart, Lung, and [employer] Institute: www.nhlbi.nih.gov/health/health-topics/topics/dash/     This information is not intended to replace advice given to you by your health care provider. Make sure you discuss any questions you have with your health care provider.     Document Released: 08/08/2011 Document Revised: 09/09/2014 Document Reviewed: 06/23/2013  Elsevier Interactive Patient Education ©2016 [employer]

## 2016-02-07 ENCOUNTER — Other Ambulatory Visit (INDEPENDENT_AMBULATORY_CARE_PROVIDER_SITE_OTHER): Payer: Managed Care, Other (non HMO)

## 2016-02-07 DIAGNOSIS — R7989 Other specified abnormal findings of blood chemistry: Secondary | ICD-10-CM

## 2016-02-07 DIAGNOSIS — E782 Mixed hyperlipidemia: Secondary | ICD-10-CM

## 2016-02-07 DIAGNOSIS — R946 Abnormal results of thyroid function studies: Secondary | ICD-10-CM

## 2016-02-07 DIAGNOSIS — E781 Pure hyperglyceridemia: Secondary | ICD-10-CM | POA: Diagnosis not present

## 2016-02-07 DIAGNOSIS — R739 Hyperglycemia, unspecified: Secondary | ICD-10-CM

## 2016-02-07 LAB — LIPID PANEL
Cholesterol: 186 mg/dL (ref 0–200)
HDL: 34.7 mg/dL — AB (ref >39.00)
NONHDL: 151.27
Total CHOL/HDL Ratio: 5
Triglycerides: 265 mg/dL — ABNORMAL HIGH (ref 0.0–149.0)
VLDL: 53 mg/dL — ABNORMAL HIGH (ref 0.0–40.0)

## 2016-02-07 LAB — COMPREHENSIVE METABOLIC PANEL
ALBUMIN: 3.7 g/dL (ref 3.5–5.2)
ALK PHOS: 73 U/L (ref 39–117)
ALT: 17 U/L (ref 0–53)
AST: 13 U/L (ref 0–37)
BILIRUBIN TOTAL: 0.4 mg/dL (ref 0.2–1.2)
BUN: 15 mg/dL (ref 6–23)
CO2: 30 mEq/L (ref 19–32)
CREATININE: 0.95 mg/dL (ref 0.40–1.50)
Calcium: 9.1 mg/dL (ref 8.4–10.5)
Chloride: 104 mEq/L (ref 96–112)
GFR: 108.31 mL/min (ref >60.00)
Glucose, Bld: 95 mg/dL (ref 70–99)
POTASSIUM: 4.1 meq/L (ref 3.5–5.1)
Sodium: 138 mEq/L (ref 135–145)
TOTAL PROTEIN: 7.3 g/dL (ref 6.0–8.3)

## 2016-02-07 LAB — CBC
HEMATOCRIT: 46.3 % (ref 39.0–52.0)
HEMOGLOBIN: 15 g/dL (ref 13.0–17.0)
MCHC: 32.4 g/dL (ref 30.0–36.0)
MCV: 86.1 fl (ref 78.0–100.0)
PLATELETS: 266 10*3/uL (ref 150.0–400.0)
RBC: 5.38 Mil/uL (ref 4.22–5.81)
RDW: 14 % (ref 11.5–15.5)
WBC: 9.5 10*3/uL (ref 4.0–10.5)

## 2016-02-07 LAB — LDL CHOLESTEROL, DIRECT: Direct LDL: 89 mg/dL

## 2016-02-07 LAB — HEMOGLOBIN A1C: HEMOGLOBIN A1C: 6 % (ref 4.6–6.5)

## 2016-02-07 LAB — TSH: TSH: 2.74 u[IU]/mL (ref 0.35–4.50)

## 2016-02-13 ENCOUNTER — Ambulatory Visit (HOSPITAL_BASED_OUTPATIENT_CLINIC_OR_DEPARTMENT_OTHER): Payer: Managed Care, Other (non HMO) | Attending: Family Medicine | Admitting: Pulmonary Disease

## 2016-02-13 VITALS — Ht 70.0 in | Wt 293.0 lb

## 2016-02-13 DIAGNOSIS — G4733 Obstructive sleep apnea (adult) (pediatric): Secondary | ICD-10-CM | POA: Diagnosis not present

## 2016-02-13 DIAGNOSIS — F513 Sleepwalking [somnambulism]: Secondary | ICD-10-CM | POA: Insufficient documentation

## 2016-02-13 DIAGNOSIS — E669 Obesity, unspecified: Secondary | ICD-10-CM | POA: Insufficient documentation

## 2016-02-13 DIAGNOSIS — Z6841 Body Mass Index (BMI) 40.0 and over, adult: Secondary | ICD-10-CM | POA: Insufficient documentation

## 2016-02-13 DIAGNOSIS — R0683 Snoring: Secondary | ICD-10-CM | POA: Insufficient documentation

## 2016-02-16 NOTE — Assessment & Plan Note (Signed)
Encouraged heart healthy diet, increase exercise, avoid trans fats, consider a krill oil cap daily 

## 2016-02-16 NOTE — Assessment & Plan Note (Signed)
Encouraged DASH diet, decrease po intake and increase exercise as tolerated. Needs 7-8 hours of sleep nightly. Avoid trans fats, eat small, frequent meals every 4-5 hours with lean proteins, complex carbs and healthy fats. Minimize simple carbs 
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
## 2016-02-16 NOTE — Assessment & Plan Note (Signed)
hgba1c acceptable, minimize simple carbs. Increase exercise as tolerated. Continue current meds 

## 2016-02-19 DIAGNOSIS — G4730 Sleep apnea, unspecified: Secondary | ICD-10-CM | POA: Diagnosis not present

## 2016-02-19 NOTE — Procedures (Signed)
Patient Name: Ross Spencer, Ross Spencer Date: 02/13/2016 Gender: Male D.O.B: [birth year] Age (years): 50 Referring Provider: Mosie Lukes Height (inches): 22 Interpreting Physician: Kara Mead MD, ABSM Weight (lbs): 293 RPSGT: Laren Everts BMI: 42 MRN: [id number] Neck Size: 19.00   CLINICAL INFORMATION Sleep Study Type: Split Night CPAP Indication for sleep study: Obesity, Sleep walking/talking/parasomnias, Snoring Epworth Sleepiness Score: 4   SLEEP STUDY TECHNIQUE As per the AASM Manual for the Scoring of Sleep and Associated Events v2.3 (April 2016) with a hypopnea requiring 4% desaturations. The channels recorded and monitored were frontal, central and occipital EEG, electrooculogram (EOG), submentalis EMG (chin), nasal and oral airflow, thoracic and abdominal wall motion, anterior tibialis EMG, snore microphone, electrocardiogram, and pulse oximetry. Continuous positive airway pressure (CPAP) was initiated when the patient met split night criteria and was titrated according to treat sleep-disordered breathing.   RESPIRATORY PARAMETERS Diagnostic Total AHI (/hr): 91.6 RDI (/hr): 102.6 OA Index (/hr): 15.6 CA Index (/hr): 0.0 REM AHI (/hr): 87.3 NREM AHI (/hr): 92.2 Supine AHI (/hr): 91.6 Non-supine AHI (/hr): N/A Min O2 Sat (%): 70.00 Mean O2 (%): 93.44 Time below 88% (min): 12.6     Titration Optimal Pressure (cm): 14 AHI at Optimal Pressure (/hr): 3.0 Min O2 at Optimal Pressure (%): 94.0 Supine % at Optimal (%): 88 Sleep % at Optimal (%): 63     SLEEP ARCHITECTURE The recording time for the entire night was 383.1 minutes. During a baseline period of 189.7 minutes, the patient slept for 131.0 minutes in REM and nonREM, yielding a sleep efficiency of 69.1%. Sleep onset after lights out was 49.8 minutes with a REM latency of 78.0 minutes. The patient spent 25.57% of the night in stage N1 sleep, 61.83% in stage N2 sleep, 0.00% in stage N3 and 12.60% in REM. During the  titration period of 187.3 minutes, the patient slept for 149.0 minutes in REM and nonREM, yielding a sleep efficiency of 79.6%. Sleep onset after CPAP initiation was 3.0 minutes with a REM latency of 35.5 minutes. The patient spent 17.45% of the night in stage N1 sleep, 57.38% in stage N2 sleep, 0.00% in stage N3 and 25.17% in REM.   CARDIAC DATA The 2 lead EKG demonstrated sinus rhythm. The mean heart rate was 74.27 beats per minute. Other EKG findings include: None.   LEG MOVEMENT DATA The total Periodic Limb Movements of Sleep (PLMS) were 54. The PLMS index was 11.57 . IMPRESSIONS - Severe obstructive sleep apnea occurred during the diagnostic portion of the study (AHI = 91.6/hour). An optimal PAP pressure was selected for this patient ( 14 cm of water) - No significant central sleep apnea occurred during the diagnostic portion of the study (CAI = 0.0/hour). - Severe oxygen desaturation was noted during the diagnostic portion of the study (Min O2 = 70.00%). - The patient snored with Loud snoring volume during the diagnostic portion of the study. - No cardiac abnormalities were noted during this study. - Clinically significant periodic limb movements did not occur during sleep.   DIAGNOSIS - Obstructive Sleep Apnea (327.23 [G47.33 ICD-10])   RECOMMENDATIONS - Trial of CPAP therapy on 14 cm H2O with a Medium size Fisher&Paykel Full Face Mask Simplus mask and heated humidification. - Avoid alcohol, sedatives and other CNS depressants that may worsen sleep apnea and disrupt normal sleep architecture. - Sleep hygiene should be reviewed to assess factors that may improve sleep quality. - Weight management and regular exercise should be initiated or continued. - Return to [place]  for re-evaluation after 4 weeks of therapy  Kara Mead MD. FCCP. [facility]   02/19/2016

## 2016-06-12 ENCOUNTER — Ambulatory Visit (INDEPENDENT_AMBULATORY_CARE_PROVIDER_SITE_OTHER): Payer: Managed Care, Other (non HMO) | Admitting: Orthopedic Surgery

## 2016-06-17 ENCOUNTER — Ambulatory Visit (INDEPENDENT_AMBULATORY_CARE_PROVIDER_SITE_OTHER): Payer: Self-pay | Admitting: Orthopedic Surgery

## 2016-07-31 ENCOUNTER — Ambulatory Visit (INDEPENDENT_AMBULATORY_CARE_PROVIDER_SITE_OTHER): Payer: BLUE CROSS/BLUE SHIELD | Admitting: Orthopedic Surgery

## 2016-07-31 ENCOUNTER — Encounter (INDEPENDENT_AMBULATORY_CARE_PROVIDER_SITE_OTHER): Payer: Self-pay | Admitting: Orthopedic Surgery

## 2016-07-31 DIAGNOSIS — M25572 Pain in left ankle and joints of left foot: Secondary | ICD-10-CM

## 2016-07-31 DIAGNOSIS — G8929 Other chronic pain: Secondary | ICD-10-CM | POA: Diagnosis not present

## 2016-07-31 DIAGNOSIS — M25562 Pain in left knee: Secondary | ICD-10-CM

## 2016-07-31 MED ORDER — METHYLPREDNISOLONE ACETATE 40 MG/ML IJ SUSP
40.0000 mg | INTRAMUSCULAR | Status: AC | PRN
  Administered 2016-07-31: 40 mg via INTRA_ARTICULAR

## 2016-07-31 MED ORDER — DICLOFENAC SODIUM 2 % TD SOLN: 2.0000 | Freq: Two times a day (BID) | TRANSDERMAL | 0 refills | Status: DC

## 2016-07-31 MED ORDER — NITROGLYCERIN 0.2 MG/HR TD PT24: 0.2000 mg | MEDICATED_PATCH | Freq: Every day | TRANSDERMAL | Status: DC

## 2016-07-31 MED ORDER — LIDOCAINE HCL 1 % IJ SOLN
5.0000 mL | INTRAMUSCULAR | Status: AC | PRN
  Administered 2016-07-31: 5 mL

## 2016-07-31 MED ORDER — BUPIVACAINE HCL 0.25 % IJ SOLN
4.0000 mL | INTRAMUSCULAR | Status: AC | PRN
  Administered 2016-07-31: 4 mL via INTRA_ARTICULAR
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
## 2016-07-31 NOTE — Addendum Note (Signed)
Addended byCherre Huger: Khalilah Hoke on: 07/31/2016 03:03 PM   Modules accepted: Orders

## 2016-08-09 ENCOUNTER — Telehealth: Payer: Self-pay | Admitting: Family Medicine

## 2016-08-09 NOTE — Telephone Encounter (Signed)
OK to use 

## 2016-08-09 NOTE — Telephone Encounter (Signed)
Patient Monday 08/12/16 [facility] to provider meeting please advise if Tuesday 09/10/16 at 11am 2 same day is ok, please advise

## 2016-08-12 ENCOUNTER — Ambulatory Visit: Payer: Managed Care, Other (non HMO) | Admitting: Family Medicine

## 2016-08-12 ENCOUNTER — Encounter: Payer: Managed Care, Other (non HMO) | Admitting: Family Medicine

## 2016-08-28 ENCOUNTER — Encounter (HOSPITAL_BASED_OUTPATIENT_CLINIC_OR_DEPARTMENT_OTHER): Payer: Self-pay | Admitting: *Deleted

## 2016-08-28 ENCOUNTER — Emergency Department (HOSPITAL_BASED_OUTPATIENT_CLINIC_OR_DEPARTMENT_OTHER)
Admission: EM | Admit: 2016-08-28 | Discharge: 2016-08-28 | Disposition: A | Discharge status: Home / Self Care | Payer: Managed Care, Other (non HMO) | Attending: Emergency Medicine | Admitting: Emergency Medicine

## 2016-08-28 DIAGNOSIS — G5682 Other specified mononeuropathies of left upper limb: Secondary | ICD-10-CM | POA: Insufficient documentation

## 2016-08-28 DIAGNOSIS — G589 Mononeuropathy, unspecified: Secondary | ICD-10-CM

## 2016-08-28 DIAGNOSIS — G5612 Other lesions of median nerve, left upper limb: Secondary | ICD-10-CM

## 2016-08-28 DIAGNOSIS — M79602 Pain in left arm: Secondary | ICD-10-CM

## 2016-08-28 MED ORDER — NAPROXEN 500 MG PO TABS: 500.0000 mg | ORAL_TABLET | Freq: Two times a day (BID) | ORAL | 0 refills | Status: AC
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
## 2016-08-28 NOTE — ED Notes (Signed)
Pt verbalizes understanding of d/c instructions and denies any further needs at this time. 

## 2016-08-28 NOTE — ED Triage Notes (Signed)
Pt c/o left arm pain and 5th and 4 finger numbness x 2 weeks

## 2016-08-28 NOTE — ED Notes (Signed)
ED Provider at bedside. 

## 2016-09-10 ENCOUNTER — Encounter: Payer: Managed Care, Other (non HMO) | Admitting: Family Medicine

## 2016-10-09 ENCOUNTER — Ambulatory Visit (INDEPENDENT_AMBULATORY_CARE_PROVIDER_SITE_OTHER): Payer: BLUE CROSS/BLUE SHIELD | Admitting: Family Medicine

## 2016-10-09 ENCOUNTER — Encounter: Payer: Self-pay | Admitting: Family Medicine

## 2016-10-09 DIAGNOSIS — M25522 Pain in left elbow: Secondary | ICD-10-CM | POA: Diagnosis not present

## 2016-10-09 NOTE — Patient Instructions (Addendum)
You have cubital tunnel syndrome and distal biceps tendinitis. Elbow sleeve for compression - wear as often as possible including if you can tolerate when sleeping. Prednisone dose pack for 6 days. Day after finishing this you can take aleve 2 tabs twice a day OR ibuprofen 600mg  three times a day with food. Arm curls and hammer rotation exercises 3 sets of 10 once a day. Heat if needed for 15 minutes at a time 3-4 times a day. Consider physical therapy, bledsoe brace if not improving as expected. Follow up with me in 5-6 weeks for reevaluation.
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
## 2016-10-10 ENCOUNTER — Telehealth: Payer: Self-pay | Admitting: Family Medicine

## 2016-10-10 DIAGNOSIS — M25522 Pain in left elbow: Secondary | ICD-10-CM | POA: Insufficient documentation

## 2016-10-10 MED ORDER — PREDNISONE 10 MG PO TABS: ORAL_TABLET | ORAL | 0 refills | Status: DC

## 2016-10-10 NOTE — Telephone Encounter (Signed)
Sorry about that!  It should be there now.  Ask him to call them first to make sure it went through before he picks it up.  Thanks!

## 2016-10-10 NOTE — Assessment & Plan Note (Signed)
consistent with combination of cubital tunnel syndrome and biceps tendinitis.  Elbow sleeve for compression.  He would like to try prednisone for cubital tunnel - can take aleve or ibuprofen after completing this.  Shown home exercises to do for biceps tendon but not to flex beyond 90 degrees to exacerbate cubital tunnel.  Consider physical therapy, bledsoe brace if not improving.  F/u in 5-6 weeks.

## 2016-10-23 ENCOUNTER — Encounter (INDEPENDENT_AMBULATORY_CARE_PROVIDER_SITE_OTHER): Payer: Self-pay | Admitting: Orthopedic Surgery

## 2016-10-23 ENCOUNTER — Ambulatory Visit (INDEPENDENT_AMBULATORY_CARE_PROVIDER_SITE_OTHER): Payer: BLUE CROSS/BLUE SHIELD | Admitting: Orthopedic Surgery

## 2016-10-23 DIAGNOSIS — S83242D Other tear of medial meniscus, current injury, left knee, subsequent encounter: Secondary | ICD-10-CM | POA: Diagnosis not present

## 2016-10-23 DIAGNOSIS — M25462 Effusion, left knee: Secondary | ICD-10-CM | POA: Diagnosis not present
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
## 2016-11-20 ENCOUNTER — Encounter: Payer: Self-pay | Admitting: Family Medicine

## 2016-11-20 ENCOUNTER — Ambulatory Visit (INDEPENDENT_AMBULATORY_CARE_PROVIDER_SITE_OTHER): Payer: BLUE CROSS/BLUE SHIELD | Admitting: Family Medicine

## 2016-11-20 ENCOUNTER — Encounter (INDEPENDENT_AMBULATORY_CARE_PROVIDER_SITE_OTHER): Payer: Self-pay

## 2016-11-20 ENCOUNTER — Ambulatory Visit: Payer: Self-pay | Admitting: Family Medicine

## 2016-11-20 ENCOUNTER — Emergency Department (HOSPITAL_BASED_OUTPATIENT_CLINIC_OR_DEPARTMENT_OTHER)
Admission: EM | Admit: 2016-11-20 | Discharge: 2016-11-20 | Disposition: A | Discharge status: Home / Self Care | Payer: BLUE CROSS/BLUE SHIELD | Attending: Emergency Medicine | Admitting: Emergency Medicine

## 2016-11-20 ENCOUNTER — Encounter (HOSPITAL_BASED_OUTPATIENT_CLINIC_OR_DEPARTMENT_OTHER): Payer: Self-pay

## 2016-11-20 DIAGNOSIS — Y929 Unspecified place or not applicable: Secondary | ICD-10-CM | POA: Diagnosis not present

## 2016-11-20 DIAGNOSIS — M25522 Pain in left elbow: Secondary | ICD-10-CM

## 2016-11-20 DIAGNOSIS — S29012A Strain of muscle and tendon of back wall of thorax, initial encounter: Secondary | ICD-10-CM | POA: Diagnosis not present

## 2016-11-20 DIAGNOSIS — Y999 Unspecified external cause status: Secondary | ICD-10-CM | POA: Diagnosis not present

## 2016-11-20 DIAGNOSIS — S39011A Strain of muscle, fascia and tendon of abdomen, initial encounter: Secondary | ICD-10-CM | POA: Insufficient documentation

## 2016-11-20 DIAGNOSIS — Y9389 Activity, other specified: Secondary | ICD-10-CM | POA: Diagnosis not present

## 2016-11-20 DIAGNOSIS — Z043 Encounter for examination and observation following other accident: Secondary | ICD-10-CM | POA: Diagnosis not present

## 2016-11-20 DIAGNOSIS — S3992XA Unspecified injury of lower back, initial encounter: Secondary | ICD-10-CM | POA: Diagnosis present

## 2016-11-20 DIAGNOSIS — W19XXXA Unspecified fall, initial encounter: Secondary | ICD-10-CM

## 2016-11-20 MED ORDER — CYCLOBENZAPRINE HCL 10 MG PO TABS: 10.0000 mg | ORAL_TABLET | Freq: Two times a day (BID) | ORAL | 0 refills | Status: DC | PRN

## 2016-11-20 MED ORDER — IBUPROFEN 600 MG PO TABS: 600.0000 mg | ORAL_TABLET | Freq: Four times a day (QID) | ORAL | 0 refills | Status: DC | PRN

## 2016-11-20 MED ORDER — TRAMADOL HCL 50 MG PO TABS: 100.0000 mg | ORAL_TABLET | Freq: Four times a day (QID) | ORAL | 0 refills | Status: DC | PRN

## 2016-11-20 NOTE — ED Triage Notes (Signed)
Pt med info was last reviwed by Dr Pearletha For[facility]nall staff-when pt questioned, pt states he was seen today for elbow pain "he did look at my back and said it was a muscle"
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
## 2016-11-20 NOTE — ED Triage Notes (Signed)
Pt states he fell out the back of truck today-pain to left mid back-NAD-steady gait

## 2016-11-20 NOTE — Patient Instructions (Signed)
Continue with the home exercises for your elbow. Call me if you want to try nitro patches or physical therapy.  Use the sleeve only if needed. Follow up with me as needed.
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
## 2016-11-22 NOTE — Assessment & Plan Note (Signed)
Cubital tunnel syndrome improved - still with distal biceps tendinitis.  Sleeve if needed.  Continue home exercises.  Tylenol, aleve, or ibuprofen if needed.  Icing if needed also.  Call us if he would like to try nitro patches or physical therapy.  F/u prn otherwise.

## 2017-01-20 ENCOUNTER — Encounter: Payer: Self-pay | Admitting: Family Medicine

## 2017-01-20 ENCOUNTER — Ambulatory Visit (INDEPENDENT_AMBULATORY_CARE_PROVIDER_SITE_OTHER): Payer: BLUE CROSS/BLUE SHIELD | Admitting: Family Medicine

## 2017-01-20 DIAGNOSIS — R05 Cough: Secondary | ICD-10-CM

## 2017-01-20 DIAGNOSIS — R059 Cough, unspecified: Secondary | ICD-10-CM | POA: Insufficient documentation

## 2017-01-20 DIAGNOSIS — R739 Hyperglycemia, unspecified: Secondary | ICD-10-CM | POA: Diagnosis not present

## 2017-01-20 HISTORY — DX: Cough, unspecified: R05.9

## 2017-01-20 MED ORDER — HYDROCODONE-HOMATROPINE 5-1.5 MG/5ML PO SYRP: 5.0000 mL | ORAL_SOLUTION | Freq: Three times a day (TID) | ORAL | 0 refills | Status: DC | PRN

## 2017-01-20 MED ORDER — AZITHROMYCIN 250 MG PO TABS: ORAL_TABLET | ORAL | 0 refills | Status: DC

## 2017-01-20 NOTE — Assessment & Plan Note (Addendum)
Atypical pneumonia vs bronchitis. Had a course of steroids and some tessalon perles and it helped temporarily while on the meds but essentially finished his cough worsened. His cough is keeping him up at night and is very deep in the chest at this point. Does have some shortness of breath with cough. We'll start him on Mucinex, vitamin C, zinc, Elderberry increased rest, increased hydration allowed Hydromet to use 1 teaspoon daily at bedtime for sleep and if no improvement is given a prescription for azithromycin to initiate.

## 2017-01-20 NOTE — Progress Notes (Signed)
Pre visit review using our clinic review tool, if applicable. No additional management support is needed unless otherwise documented below in the visit note. 
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
## 2017-01-20 NOTE — Patient Instructions (Addendum)
Plain Mucinex twice daily x 7 days, probiotics, elderberry liquid for cough, Aged or black garlic, zinc, Vitamin C 500 to 1000 mg daily Call if worse    Pertussis, Adult Pertussis, also called whooping cough, is an infection that causes severe and sudden coughing attacks. Pertussis spreads easily from person to person (is contagious). It spreads through the droplets that are sprayed in the air when an infected person talks, coughs, or sneezes. Symptoms of pertussis can last for up to 6 weeks, even though the cough starts to get better. It may take as long as 6 months for the cough to go away completely. What are the causes? This condition is caused by bacteria. The bacteria can spread to someone when he or she:  Inhales droplets that have been sprayed in the air by an infected person.  Touches a surface where the droplets fell and then touches his or her mouth or nose. What are the signs or symptoms? Symptoms of this condition include cold-like symptoms, such as:  A runny nose.  Low fever.  Mild cough.  Red, watery eyes. These symptoms develop at the beginning of the infection. After 1-2 weeks, the cold symptoms get better, but the cough worsens, and severe and sudden coughing attacks frequently develop. During these attacks, people may cough so hard that vomiting occurs. How is this diagnosed? This condition may be diagnosed by:  A physical exam.  Lab tests of mucus from the nose and throat.  A blood test.  A chest X-ray. How is this treated? This condition is treated with antibiotic medicines.  Starting antibiotics quickly may help shorten the illness and make it less contagious.  Antibiotics may also be prescribed for everyone who lives in the same household.  Immunization may be recommended for people in the household who are at risk of developing pertussis. At-risk groups include:  Infants.  Those who have not had their full course of pertussis  immunizations.  Those who were immunized but have not had their recent booster shot. Mild coughing may continue for months after the infection is treated. This coughing may be due to the remaining soreness and inflammation in the lungs. Follow these instructions at home: Medicines    Take over-the-counter and prescription medicines only as told by your health care provider.  Take your antibiotic medicine as told by your health care provider. Do not stop taking the antibiotic even if you start to feel better.  Do not take cough medicine unless told by your health care provider. Coughing attack   If you are having a coughing attack:  Raise (elevate) the head of your mattress or raise your head with pillows to improve breathing. This will also make it easier to clear out mucus that is brought up from the lungs to the throat during a cough (sputum).  Sit upright.  Avoid substances that may irritate the lungs, such as smoke, aerosols, or fumes. These substances may make your coughing worse.  Use a cool mist humidifier at home to increase air moisture. This will soothe your cough and help to loosen sputum. Do not use hot steam. Prevent infection   For the first 5 days of antibiotic treatment, stay away from those who are at risk of developing pertussis. If no antibiotics are prescribed, stay at home for the first 3 weeks that you are coughing or as told by your health care provider.  Do not go to work until you have been treated with antibiotics for 5 days. If  no antibiotics are prescribed, do not go to work for the first 3 weeks that you are coughing or as told by your health care provider. Tell your workplace that you were diagnosed with pertussis.  Wash your hands often with soap and water. Everyone in your household should also wash hands often to avoid spreading the infection. If soap and water are not available, use hand sanitizer. General instructions   Rest as much as possible.  Slowly go back to your normal activities as told by your health care provider.  Drink enough fluid to keep your urine clear or pale yellow.  Keep all follow-up visits as told by your health care provider. This is important. How is this prevented?  Pertussis can be prevented with a vaccine and later booster shots.  The pertussis vaccine is given during childhood.  If you are an adult who was never vaccinated, get vaccinated as soon as possible.  If you are an adult who was previously vaccinated, talk with your health care providers about the need for a booster shot because immunity from the vaccine decreases over time.  All of the following people should consider receiving a booster dose of pertussis:  Pregnant women.  Everyone who has or will have close contact with an infant who is less than 3512 months old.  All health care personnel. Contact a health care provider if:  You cannot stop vomiting.  You are not able to eat or drink.  Your cough does not improve.  You have a fever. Get help right away if:  Your face turns red or blue during a coughing attack.  You pass out after a coughing attack, even if only for a few moments.  Your breathing stops for a period of time (apnea).  You are restless or cannot sleep.  You feel sluggish or you are sleeping too much. This information is not intended to replace advice given to you by your health care provider. Make sure you discuss any questions you have with your health care provider. Document Released: 12/14/2012 Document Revised: 03/09/2016 Document Reviewed: 02/05/2016 Elsevier Interactive Patient Education  2017 [employer]

## 2017-01-21 NOTE — Assessment & Plan Note (Signed)
minimize simple carbs. Increase exercise as tolerated.  

## 2017-03-09 ENCOUNTER — Emergency Department (HOSPITAL_BASED_OUTPATIENT_CLINIC_OR_DEPARTMENT_OTHER)
Admission: EM | Admit: 2017-03-09 | Discharge: 2017-03-09 | Disposition: A | Discharge status: Home / Self Care | Payer: BLUE CROSS/BLUE SHIELD | Attending: Emergency Medicine | Admitting: Emergency Medicine

## 2017-03-09 ENCOUNTER — Emergency Department (HOSPITAL_BASED_OUTPATIENT_CLINIC_OR_DEPARTMENT_OTHER): Payer: BLUE CROSS/BLUE SHIELD

## 2017-03-09 ENCOUNTER — Encounter (HOSPITAL_BASED_OUTPATIENT_CLINIC_OR_DEPARTMENT_OTHER): Payer: Self-pay | Admitting: Emergency Medicine

## 2017-03-09 DIAGNOSIS — I8391 Asymptomatic varicose veins of right lower extremity: Secondary | ICD-10-CM | POA: Insufficient documentation

## 2017-03-09 DIAGNOSIS — I8390 Asymptomatic varicose veins of unspecified lower extremity: Secondary | ICD-10-CM

## 2017-03-09 DIAGNOSIS — L989 Disorder of the skin and subcutaneous tissue, unspecified: Secondary | ICD-10-CM | POA: Diagnosis present

## 2017-03-09 DIAGNOSIS — M79606 Pain in leg, unspecified: Secondary | ICD-10-CM
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
## 2017-03-09 NOTE — ED Notes (Signed)
Patient transported to ultrasound.

## 2017-03-09 NOTE — ED Triage Notes (Signed)
Pt is concerned about discolored hardened area to inside of R lower leg. States it has been there for "awhile" and today he just decided to get it checked. Denies pain.

## 2017-06-11 ENCOUNTER — Ambulatory Visit (INDEPENDENT_AMBULATORY_CARE_PROVIDER_SITE_OTHER): Payer: BLUE CROSS/BLUE SHIELD

## 2017-06-11 ENCOUNTER — Ambulatory Visit (INDEPENDENT_AMBULATORY_CARE_PROVIDER_SITE_OTHER): Payer: BLUE CROSS/BLUE SHIELD | Admitting: Orthopedic Surgery

## 2017-06-11 ENCOUNTER — Encounter (INDEPENDENT_AMBULATORY_CARE_PROVIDER_SITE_OTHER): Payer: Self-pay | Admitting: Orthopedic Surgery

## 2017-06-11 DIAGNOSIS — M25512 Pain in left shoulder: Secondary | ICD-10-CM

## 2017-06-11 MED ORDER — DICLOFENAC SODIUM 2 % TD SOLN: 2.0000 | Freq: Two times a day (BID) | TRANSDERMAL | 1 refills | Status: DC

## 2017-06-11 MED ORDER — IBUPROFEN-FAMOTIDINE 800-26.6 MG PO TABS: 1.0000 | ORAL_TABLET | Freq: Two times a day (BID) | ORAL | 0 refills | Status: DC | PRN
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
## 2017-06-23 ENCOUNTER — Encounter: Payer: Self-pay | Admitting: Gastroenterology

## 2017-06-23 ENCOUNTER — Encounter: Payer: Self-pay | Admitting: Family Medicine

## 2017-06-23 ENCOUNTER — Ambulatory Visit (INDEPENDENT_AMBULATORY_CARE_PROVIDER_SITE_OTHER): Payer: BLUE CROSS/BLUE SHIELD | Admitting: Family Medicine

## 2017-06-23 DIAGNOSIS — Z23 Encounter for immunization: Secondary | ICD-10-CM

## 2017-06-23 DIAGNOSIS — Z0000 Encounter for general adult medical examination without abnormal findings: Secondary | ICD-10-CM | POA: Diagnosis not present

## 2017-06-23 DIAGNOSIS — Z125 Encounter for screening for malignant neoplasm of prostate: Secondary | ICD-10-CM | POA: Diagnosis not present

## 2017-06-23 DIAGNOSIS — G8929 Other chronic pain: Secondary | ICD-10-CM | POA: Diagnosis not present

## 2017-06-23 DIAGNOSIS — M79672 Pain in left foot: Secondary | ICD-10-CM | POA: Diagnosis not present

## 2017-06-23 DIAGNOSIS — R7989 Other specified abnormal findings of blood chemistry: Secondary | ICD-10-CM | POA: Diagnosis not present

## 2017-06-23 DIAGNOSIS — Z1211 Encounter for screening for malignant neoplasm of colon: Secondary | ICD-10-CM | POA: Diagnosis not present

## 2017-06-23 DIAGNOSIS — R739 Hyperglycemia, unspecified: Secondary | ICD-10-CM | POA: Diagnosis not present

## 2017-06-23 DIAGNOSIS — E782 Mixed hyperlipidemia: Secondary | ICD-10-CM | POA: Diagnosis not present

## 2017-06-23 HISTORY — DX: Encounter for screening for malignant neoplasm of colon: Z12.11

## 2017-06-23 LAB — COMPREHENSIVE METABOLIC PANEL
ALT: 14 U/L (ref 0–53)
AST: 13 U/L (ref 0–37)
Albumin: 3.9 g/dL (ref 3.5–5.2)
Alkaline Phosphatase: 70 U/L (ref 39–117)
BUN: 13 mg/dL (ref 6–23)
CO2: 31 meq/L (ref 19–32)
Calcium: 9.3 mg/dL (ref 8.4–10.5)
Chloride: 102 mEq/L (ref 96–112)
Creatinine, Ser: 0.93 mg/dL (ref 0.40–1.50)
GFR: 110.38 mL/min (ref >60.00)
Glucose, Bld: 90 mg/dL (ref 70–99)
Potassium: 4.8 mEq/L (ref 3.5–5.1)
Sodium: 139 mEq/L (ref 135–145)
Total Bilirubin: 0.7 mg/dL (ref 0.2–1.2)
Total Protein: 7.2 g/dL (ref 6.0–8.3)

## 2017-06-23 LAB — CBC
HEMATOCRIT: 46.7 % (ref 39.0–52.0)
Hemoglobin: 15.1 g/dL (ref 13.0–17.0)
MCHC: 32.3 g/dL (ref 30.0–36.0)
MCV: 89.2 fl (ref 78.0–100.0)
Platelets: 252 10*3/uL (ref 150.0–400.0)
RBC: 5.24 Mil/uL (ref 4.22–5.81)
RDW: 13.6 % (ref 11.5–15.5)
WBC: 7.3 10*3/uL (ref 4.0–10.5)

## 2017-06-23 LAB — LIPID PANEL
CHOL/HDL RATIO: 4
Cholesterol: 179 mg/dL (ref 0–200)
HDL: 46 mg/dL (ref >39.00)
LDL CALC: 101 mg/dL — AB (ref 0–99)
NONHDL: 132.64
Triglycerides: 157 mg/dL — ABNORMAL HIGH (ref 0.0–149.0)
VLDL: 31.4 mg/dL (ref 0.0–40.0)

## 2017-06-23 LAB — PSA: PSA: 2.33 ng/mL (ref 0.10–4.00)

## 2017-06-23 LAB — HEMOGLOBIN A1C: HEMOGLOBIN A1C: 6.1 % (ref 4.6–6.5)

## 2017-06-23 LAB — TSH: TSH: 0.88 u[IU]/mL (ref 0.35–4.50)
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
## 2017-06-23 NOTE — Assessment & Plan Note (Signed)
Check TSH 

## 2017-06-23 NOTE — Assessment & Plan Note (Signed)
Encouraged heart healthy diet, increase exercise, avoid trans fats, consider a krill oil cap daily 

## 2017-06-23 NOTE — Patient Instructions (Addendum)
DASH or MIND diet Shingrix is the new shingles shot, 2 shots over 2-6 months , can get it here or at pharmacy.  Try Lidocaine patch or gel, made by [employer], [employer] or [place] Years, Male Preventive care refers to lifestyle choices and visits with your health care provider that can promote health and wellness. What does preventive care include?  A yearly physical exam. This is also called an annual well check.  Dental exams once or twice a year.  Routine eye exams. Ask your health care provider how often you should have your eyes checked.  Personal lifestyle choices, including: ? Daily care of your teeth and gums. ? Regular physical activity. ? Eating a healthy diet. ? Avoiding tobacco and drug use. ? Limiting alcohol use. ? Practicing safe sex. ? Taking low-dose aspirin every day starting at age 21. What happens during an annual well check? The services and screenings done by your health care provider during your annual well check will depend on your age, overall health, lifestyle risk factors, and family history of disease. Counseling Your health care provider may ask you questions about your:  Alcohol use.  Tobacco use.  Drug use.  Emotional well-being.  Home and relationship well-being.  Sexual activity.  Eating habits.  Work and work Statistician.  Screening You may have the following tests or measurements:  Height, weight, and BMI.  Blood pressure.  Lipid and cholesterol levels. These may be checked every 5 years, or more frequently if you are over 22 years old.  Skin check.  Lung cancer screening. You may have this screening every year starting at age 75 if you have a 30-pack-year history of smoking and currently smoke or have quit within the past 15 years.  Fecal occult blood test (FOBT) of the stool. You may have this test every year starting at age 82.  Flexible sigmoidoscopy or colonoscopy. You may have a sigmoidoscopy  every 5 years or a colonoscopy every 10 years starting at age 68.  Prostate cancer screening. Recommendations will vary depending on your family history and other risks.  Hepatitis C blood test.  Hepatitis B blood test.  Sexually transmitted disease (STD) testing.  Diabetes screening. This is done by checking your blood sugar (glucose) after you have not eaten for a while (fasting). You may have this done every 1-3 years.  Discuss your test results, treatment options, and if necessary, the need for more tests with your health care provider. Vaccines Your health care provider may recommend certain vaccines, such as:  Influenza vaccine. This is recommended every year.  Tetanus, diphtheria, and acellular pertussis (Tdap, Td) vaccine. You may need a Td booster every 10 years.  Varicella vaccine. You may need this if you have not been vaccinated.  Zoster vaccine. You may need this after age 105.  Measles, mumps, and rubella (MMR) vaccine. You may need at least one dose of MMR if you were born in 1957 or later. You may also need a second dose.  Pneumococcal 13-valent conjugate (PCV13) vaccine. You may need this if you have certain conditions and have not been vaccinated.  Pneumococcal polysaccharide (PPSV23) vaccine. You may need one or two doses if you smoke cigarettes or if you have certain conditions.  Meningococcal vaccine. You may need this if you have certain conditions.  Hepatitis A vaccine. You may need this if you have certain conditions or if you travel or work in places where you may be exposed to  hepatitis A.  Hepatitis B vaccine. You may need this if you have certain conditions or if you travel or work in places where you may be exposed to hepatitis B.  Haemophilus influenzae type b (Hib) vaccine. You may need this if you have certain risk factors.  Talk to your health care provider about which screenings and vaccines you need and how often you need them. This information  is not intended to replace advice given to you by your health care provider. Make sure you discuss any questions you have with your health care provider. Document Released: 09/15/2015 Document Revised: 05/08/2016 Document Reviewed: 06/20/2015 Elsevier Interactive Patient Education  2017 [employer].

## 2017-06-23 NOTE — Assessment & Plan Note (Signed)
Has been following with his orthopaedist but bothers him daily and burns after being immobile and then arising and walking. Encouraged to try topical Lidocaine and follow up with ortho if worsens

## 2017-06-23 NOTE — Assessment & Plan Note (Signed)
Encouraged DASH diet, decrease po intake and increase exercise as tolerated. Needs 7-8 hours of sleep nightly. Avoid trans fats, eat small, frequent meals every 4-5 hours with lean proteins, complex carbs and healthy fats. Minimize simple carbs,  Bariatric referral 

## 2017-06-23 NOTE — Assessment & Plan Note (Signed)
hgba1c acceptable, minimize simple carbs. Increase exercise as tolerated.  

## 2017-06-23 NOTE — Assessment & Plan Note (Signed)
Patient encouraged to maintain heart healthy diet, regular exercise, adequate sleep. Consider daily probiotics. Take medications as prescribed 

## 2017-06-23 NOTE — Assessment & Plan Note (Signed)
Referred to gastroenterology. 

## 2017-07-17 ENCOUNTER — Encounter (INDEPENDENT_AMBULATORY_CARE_PROVIDER_SITE_OTHER): Payer: Self-pay | Admitting: Orthopedic Surgery

## 2017-07-17 ENCOUNTER — Ambulatory Visit (INDEPENDENT_AMBULATORY_CARE_PROVIDER_SITE_OTHER): Payer: BLUE CROSS/BLUE SHIELD | Admitting: Orthopedic Surgery

## 2017-07-17 DIAGNOSIS — M7542 Impingement syndrome of left shoulder: Secondary | ICD-10-CM | POA: Diagnosis not present

## 2017-07-17 MED ORDER — LIDOCAINE HCL 1 % IJ SOLN
5.0000 mL | INTRAMUSCULAR | Status: AC | PRN
  Administered 2017-07-17: 5 mL

## 2017-07-17 MED ORDER — BUPIVACAINE HCL 0.5 % IJ SOLN
9.0000 mL | INTRAMUSCULAR | Status: AC | PRN
  Administered 2017-07-17: 9 mL via INTRA_ARTICULAR

## 2017-07-17 MED ORDER — METHYLPREDNISOLONE ACETATE 40 MG/ML IJ SUSP
40.0000 mg | INTRAMUSCULAR | Status: AC | PRN
  Administered 2017-07-17: 40 mg via INTRA_ARTICULAR
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
## 2017-08-08 ENCOUNTER — Ambulatory Visit (AMBULATORY_SURGERY_CENTER): Payer: Self-pay | Admitting: *Deleted

## 2017-08-08 ENCOUNTER — Other Ambulatory Visit: Payer: Self-pay

## 2017-08-08 ENCOUNTER — Telehealth: Payer: Self-pay | Admitting: Gastroenterology

## 2017-08-08 VITALS — Ht 70.0 in | Wt 280.6 lb

## 2017-08-08 DIAGNOSIS — Z1211 Encounter for screening for malignant neoplasm of colon: Secondary | ICD-10-CM

## 2017-08-08 MED ORDER — PEG-KCL-NACL-NASULF-NA ASC-C 140 G PO SOLR: 1.0000 | ORAL | 0 refills | Status: DC

## 2017-08-08 NOTE — Progress Notes (Signed)
No egg or soy allergy known to patient  No issues with past sedation with any surgeries  or procedures, no intubation problems  No diet pills per patient No home 02 use per patient  No blood thinners per patient  Pt denies issues with constipation  No A fib or A flutter  EMMI video sent to pt's e mail  

## 2017-08-08 NOTE — Telephone Encounter (Signed)
Spoke with pharmacy. Explained that we do not do PA. Spoke with patient, he will active card then take it to his pharmacy. Pharmacy was notified.

## 2017-08-11 ENCOUNTER — Encounter: Payer: Self-pay | Admitting: Gastroenterology

## 2017-08-13 ENCOUNTER — Encounter: Payer: Self-pay | Admitting: *Deleted

## 2017-08-13 ENCOUNTER — Telehealth: Payer: Self-pay | Admitting: Gastroenterology

## 2017-08-13 NOTE — Telephone Encounter (Signed)
See other TE from today- Pt came and oicked up sample of plenvu with new instructions- pt states he has already gotten this from us today- not sure who called now, was not pt  Ross Spencer PV
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
## 2017-08-14 ENCOUNTER — Ambulatory Visit (AMBULATORY_SURGERY_CENTER): Payer: BLUE CROSS/BLUE SHIELD | Admitting: Gastroenterology

## 2017-08-14 ENCOUNTER — Encounter: Payer: Self-pay | Admitting: Gastroenterology

## 2017-08-14 VITALS — BP 116/78 | HR 74 | Temp 98.6°F | Resp 11 | Ht 70.0 in | Wt 280.0 lb

## 2017-08-14 DIAGNOSIS — Z1211 Encounter for screening for malignant neoplasm of colon: Secondary | ICD-10-CM | POA: Diagnosis not present

## 2017-08-14 DIAGNOSIS — K621 Rectal polyp: Secondary | ICD-10-CM | POA: Diagnosis not present

## 2017-08-14 MED ORDER — SODIUM CHLORIDE 0.9 % IV SOLN: 500.0000 mL | Freq: Once | INTRAVENOUS | Status: DC

## 2017-08-14 NOTE — Progress Notes (Signed)
Report to PACU, RN, vss, BBS= Clear.  

## 2017-08-14 NOTE — Op Note (Signed)
[facility] Patient Name: Ross Spencer Procedure Date: 08/14/2017 11:06 AM MRN: [id number] Endoscopist: Napoleon Form , MD Age: 51 Referring MD:  Date of Birth: 09-Dec-1965 Gender: Male Account #: [id number] Procedure:                Colonoscopy Indications:              Screening for colorectal malignant neoplasm, This                            is the patient's first colonoscopy Medicines:                Monitored Anesthesia Care Procedure:                Pre-Anesthesia Assessment:                           - Prior to the procedure, a History and Physical                            was performed, and patient medications and                            allergies were reviewed. The patient's tolerance of                            previous anesthesia was also reviewed. The risks                            and benefits of the procedure and the sedation                            options and risks were discussed with the patient.                            All questions were answered, and informed consent                            was obtained. Prior Anticoagulants: The patient has                            taken no previous anticoagulant or antiplatelet                            agents. ASA Grade Assessment: III - A patient with                            severe systemic disease. After reviewing the risks                            and benefits, the patient was deemed in                            satisfactory condition to undergo the procedure.  After obtaining informed consent, the colonoscope                            was passed under direct vision. Throughout the                            procedure, the patient's blood pressure, pulse, and                            oxygen saturations were monitored continuously. The                            Colonoscope was introduced through the anus and                            advanced to the the  cecum, identified by                            appendiceal orifice and ileocecal valve. The                            colonoscopy was performed without difficulty. The                            patient tolerated the procedure well. The quality                            of the bowel preparation was excellent. The                            ileocecal valve, appendiceal orifice, and rectum                            were photographed. Scope In: 11:11:42 AM Scope Out: 11:26:31 AM Scope Withdrawal Time: 0 hours 9 minutes 24 seconds  Total Procedure Duration: 0 hours 14 minutes 49 seconds  Findings:                 The perianal and digital rectal examinations were                            normal.                           A 2 mm polyp was found in the rectum. The polyp was                            sessile. The polyp was removed with a cold biopsy                            forceps. Resection and retrieval were complete.                           Non-bleeding internal hemorrhoids were found during  retroflexion. The hemorrhoids were small.                           The exam was otherwise without abnormality. Complications:            No immediate complications. Estimated Blood Loss:     Estimated blood loss was minimal. Impression:               - One 2 mm polyp in the rectum, removed with a cold                            biopsy forceps. Resected and retrieved.                           - Non-bleeding internal hemorrhoids.                           - The examination was otherwise normal. Recommendation:           - Patient has a contact number available for                            emergencies. The signs and symptoms of potential                            delayed complications were discussed with the                            patient. Return to normal activities tomorrow.                            Written discharge instructions were provided to the                             patient.                           - Resume previous diet.                           - Continue present medications.                           - Await pathology results.                           - Repeat colonoscopy in 5-10 years for surveillance                            based on pathology results. Napoleon FormKavitha V. Nandigam, MD 08/14/2017 11:30:35 AM This report has been signed electronically.

## 2017-08-14 NOTE — Progress Notes (Signed)
Pt's states no medical or surgical changes since previsit or office visit. 

## 2017-08-14 NOTE — Progress Notes (Signed)
Called to room to assist during endoscopic procedure.  Patient ID and intended procedure confirmed with present staff. Received instructions for my participation in the procedure from the performing physician.  

## 2017-08-14 NOTE — Patient Instructions (Signed)
Impression/Recommendations:  Polyp handout given to patient. Hemorrhoid handout given to patient.  Resume previous diet. Continue present medications.  Await pathology results.  Repeat colonoscopy I 5-10 years for surveillance based on pathology results.  YOU HAD AN ENDOSCOPIC PROCEDURE TODAY AT THE [facility]:   Refer to the procedure report that was given to you for any specific questions about what was found during the examination.  If the procedure report does not answer your questions, please call your gastroenterologist to clarify.  If you requested that your care partner not be given the details of your procedure findings, then the procedure report has been included in a sealed envelope for you to review at your convenience later.  YOU SHOULD EXPECT: Some feelings of bloating in the abdomen. Passage of more gas than usual.  Walking can help get rid of the air that was put into your GI tract during the procedure and reduce the bloating. If you had a lower endoscopy (such as a colonoscopy or flexible sigmoidoscopy) you may notice spotting of blood in your stool or on the toilet paper. If you underwent a bowel prep for your procedure, you may not have a normal bowel movement for a few days.  Please Note:  You might notice some irritation and congestion in your nose or some drainage.  This is from the oxygen used during your procedure.  There is no need for concern and it should clear up in a day or so.  SYMPTOMS TO REPORT IMMEDIATELY:   Following lower endoscopy (colonoscopy or flexible sigmoidoscopy):  Excessive amounts of blood in the stool  Significant tenderness or worsening of abdominal pains  Swelling of the abdomen that is new, acute  Fever of 100F or higher  For urgent or emergent issues, a gastroenterologist can be reached at any hour by calling [phone].   DIET:  We do recommend a small meal at first, but then you may proceed to your regular diet.   Drink plenty of fluids but you should avoid alcoholic beverages for 24 hours.  ACTIVITY:  You should plan to take it easy for the rest of today and you should NOT DRIVE or use heavy machinery until tomorrow (because of the sedation medicines used during the test).    FOLLOW UP: Our staff will call the number listed on your records the next business day following your procedure to check on you and address any questions or concerns that you may have regarding the information given to you following your procedure. If we do not reach you, we will leave a message.  However, if you are feeling well and you are not experiencing any problems, there is no need to return our call.  We will assume that you have returned to your regular daily activities without incident.  If any biopsies were taken you will be contacted by phone or by letter within the next 1-3 weeks.  Please call us at [phone] if you have not heard about the biopsies in 3 weeks.    SIGNATURES/CONFIDENTIALITY: You and/or your care partner have signed paperwork which will be entered into your electronic medical record.  These signatures attest to the fact that that the information above on your After Visit Summary has been reviewed and is understood.  Full responsibility of the confidentiality of this discharge information lies with you and/or your care-partner.

## 2017-08-15 ENCOUNTER — Telehealth: Payer: Self-pay

## 2017-08-15 NOTE — Telephone Encounter (Signed)
  Follow up Call-  Call back number 08/14/2017  Post procedure Call Back phone  # [phone]  Permission to leave phone message Yes  Some recent data might be hidden     Patient questions:  Do you have a fever, pain , or abdominal swelling? No. Pain Score  0 *  Have you tolerated food without any problems? Yes.    Have you been able to return to your normal activities? Yes.    Do you have any questions about your discharge instructions: Diet   No. Medications  No. Follow up visit  No.  Do you have questions or concerns about your Care? No.  Actions: * If pain score is 4 or above: No action needed, pain <4.

## 2017-08-18 ENCOUNTER — Encounter (INDEPENDENT_AMBULATORY_CARE_PROVIDER_SITE_OTHER): Payer: Self-pay | Admitting: Orthopedic Surgery

## 2017-08-18 ENCOUNTER — Ambulatory Visit (INDEPENDENT_AMBULATORY_CARE_PROVIDER_SITE_OTHER): Payer: BLUE CROSS/BLUE SHIELD | Admitting: Orthopedic Surgery

## 2017-08-18 DIAGNOSIS — M25512 Pain in left shoulder: Secondary | ICD-10-CM

## 2017-08-20 ENCOUNTER — Encounter (INDEPENDENT_AMBULATORY_CARE_PROVIDER_SITE_OTHER): Payer: Self-pay | Admitting: Orthopedic Surgery
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
## 2017-08-28 ENCOUNTER — Telehealth (INDEPENDENT_AMBULATORY_CARE_PROVIDER_SITE_OTHER): Payer: Self-pay | Admitting: Orthopedic Surgery

## 2017-08-28 ENCOUNTER — Encounter: Payer: Self-pay | Admitting: Gastroenterology

## 2017-08-28 NOTE — Telephone Encounter (Signed)
IC s/w patient and advised. He verbalized understanding.  

## 2017-08-28 NOTE — Telephone Encounter (Signed)
[facility] lmom/fwp,   He can call [phone] to schedule appt.

## 2017-08-28 NOTE — Telephone Encounter (Signed)
Patient left voicemail needing some questions answered. Please advise # [phone]

## 2017-08-28 NOTE — Telephone Encounter (Signed)
Can you please help me with this?? IC s/w patient and he states that no one has called him to get scan scheduled. I see note in there from your prior auth but I dont see anything else.

## 2017-09-05 ENCOUNTER — Telehealth (INDEPENDENT_AMBULATORY_CARE_PROVIDER_SITE_OTHER): Payer: Self-pay

## 2017-09-05 MED ORDER — TRAMADOL HCL 50 MG PO TABS: 50.0000 mg | ORAL_TABLET | Freq: Four times a day (QID) | ORAL | 0 refills | Status: DC | PRN

## 2017-09-05 NOTE — Telephone Encounter (Signed)
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 Pt's mother called and states that he is in a lot of pain and would like rx for this.  Uses [facility] [address]. Please advise. [phone]

## 2017-09-05 NOTE — Addendum Note (Signed)
Addended by: Barnie DelZAMORA, Katrianna Friesenhahn R on: 09/05/2017 11:29 AM   Modules accepted: Orders

## 2017-09-05 NOTE — Telephone Encounter (Signed)
rx called into pharm and called pt's mother to advise.

## 2017-09-17 ENCOUNTER — Other Ambulatory Visit: Payer: Self-pay

## 2017-09-17 ENCOUNTER — Inpatient Hospital Stay
Admission: RE | Admit: 2017-09-17 | Discharge: 2017-09-17 | Disposition: A | Discharge status: Home / Self Care | Payer: Self-pay | Source: Ambulatory Visit | Attending: Orthopedic Surgery | Admitting: Orthopedic Surgery

## 2017-09-19 ENCOUNTER — Ambulatory Visit (INDEPENDENT_AMBULATORY_CARE_PROVIDER_SITE_OTHER): Payer: BLUE CROSS/BLUE SHIELD | Admitting: Orthopedic Surgery

## 2017-10-06 ENCOUNTER — Ambulatory Visit
Admission: RE | Admit: 2017-10-06 | Discharge: 2017-10-06 | Disposition: A | Discharge status: Home / Self Care | Payer: BLUE CROSS/BLUE SHIELD | Source: Ambulatory Visit | Attending: Orthopedic Surgery | Admitting: Orthopedic Surgery

## 2017-10-06 DIAGNOSIS — M25512 Pain in left shoulder: Secondary | ICD-10-CM

## 2017-10-06 DIAGNOSIS — S46012A Strain of muscle(s) and tendon(s) of the rotator cuff of left shoulder, initial encounter: Secondary | ICD-10-CM | POA: Diagnosis not present

## 2017-10-06 MED ORDER — IOPAMIDOL (ISOVUE-M 200) INJECTION 41%
15.0000 mL | Freq: Once | INTRAMUSCULAR | Status: AC
  Administered 2017-10-06: 15 mL via INTRA_ARTICULAR

## 2017-10-08 ENCOUNTER — Ambulatory Visit (INDEPENDENT_AMBULATORY_CARE_PROVIDER_SITE_OTHER): Payer: BLUE CROSS/BLUE SHIELD | Admitting: Orthopedic Surgery

## 2017-10-08 ENCOUNTER — Encounter (INDEPENDENT_AMBULATORY_CARE_PROVIDER_SITE_OTHER): Payer: Self-pay | Admitting: Orthopedic Surgery

## 2017-10-08 DIAGNOSIS — M25512 Pain in left shoulder: Secondary | ICD-10-CM | POA: Diagnosis not present

## 2017-10-08 MED ORDER — IBUPROFEN-FAMOTIDINE 800-26.6 MG PO TABS: ORAL_TABLET | ORAL | 0 refills | Status: DC

## 2017-10-11 ENCOUNTER — Encounter (INDEPENDENT_AMBULATORY_CARE_PROVIDER_SITE_OTHER): Payer: Self-pay | Admitting: Orthopedic Surgery
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
## 2017-12-29 ENCOUNTER — Ambulatory Visit (INDEPENDENT_AMBULATORY_CARE_PROVIDER_SITE_OTHER): Payer: BLUE CROSS/BLUE SHIELD | Admitting: Family Medicine

## 2017-12-29 VITALS — BP 118/78 | HR 71 | Temp 97.9°F | Resp 18 | Wt 279.8 lb

## 2017-12-29 DIAGNOSIS — G8929 Other chronic pain: Secondary | ICD-10-CM | POA: Diagnosis not present

## 2017-12-29 DIAGNOSIS — M25562 Pain in left knee: Secondary | ICD-10-CM | POA: Diagnosis not present

## 2017-12-29 DIAGNOSIS — R739 Hyperglycemia, unspecified: Secondary | ICD-10-CM | POA: Diagnosis not present

## 2017-12-29 DIAGNOSIS — M25512 Pain in left shoulder: Secondary | ICD-10-CM | POA: Diagnosis not present

## 2017-12-29 DIAGNOSIS — E782 Mixed hyperlipidemia: Secondary | ICD-10-CM

## 2017-12-29 LAB — COMPREHENSIVE METABOLIC PANEL
ALBUMIN: 3.8 g/dL (ref 3.5–5.2)
ALK PHOS: 71 U/L (ref 39–117)
ALT: 13 U/L (ref 0–53)
AST: 12 U/L (ref 0–37)
BUN: 12 mg/dL (ref 6–23)
CHLORIDE: 105 meq/L (ref 96–112)
CO2: 30 mEq/L (ref 19–32)
Calcium: 9 mg/dL (ref 8.4–10.5)
Creatinine, Ser: 0.86 mg/dL (ref 0.40–1.50)
GFR: 120.57 mL/min (ref >60.00)
Glucose, Bld: 80 mg/dL (ref 70–99)
POTASSIUM: 4 meq/L (ref 3.5–5.1)
Sodium: 138 mEq/L (ref 135–145)
TOTAL PROTEIN: 7.1 g/dL (ref 6.0–8.3)
Total Bilirubin: 0.4 mg/dL (ref 0.2–1.2)

## 2017-12-29 LAB — LIPID PANEL
CHOLESTEROL: 153 mg/dL (ref 0–200)
HDL: 41.2 mg/dL (ref >39.00)
LDL Cholesterol: 85 mg/dL (ref 0–99)
NonHDL: 112.23
Total CHOL/HDL Ratio: 4
Triglycerides: 135 mg/dL (ref 0.0–149.0)
VLDL: 27 mg/dL (ref 0.0–40.0)

## 2017-12-29 LAB — HEMOGLOBIN A1C: Hgb A1c MFr Bld: 6 % (ref 4.6–6.5)

## 2017-12-29 NOTE — Assessment & Plan Note (Signed)
hgba1c acceptable, minimize simple carbs. Increase exercise as tolerated.  

## 2017-12-29 NOTE — Assessment & Plan Note (Signed)
Worsening over past year, has an appointment with his orthopaedics later this week. Consider Sports med, PT and massage therapy. Has pain radiating up the neck and down to elbow these days

## 2017-12-29 NOTE — Assessment & Plan Note (Signed)
Encouraged DASH diet, decrease po intake and increase exercise as tolerated. Needs 7-8 hours of sleep nightly. Avoid trans fats, eat small, frequent meals every 4-5 hours with lean proteins, complex carbs and healthy fats. Minimize simple carbs 

## 2017-12-29 NOTE — Patient Instructions (Signed)
Consider Lidocaine gel or patch. Aspercreme, Icy Hot or Salon Pas   Shoulder Pain Many things can cause shoulder pain, including:  An injury to the area.  Overuse of the shoulder.  Arthritis.  The source of the pain can be:  Inflammation.  An injury to the shoulder joint.  An injury to a tendon, ligament, or bone.  Follow these instructions at home: Take these actions to help with your pain:  Squeeze a soft ball or a foam pad as much as possible. This helps to keep the shoulder from swelling. It also helps to strengthen the arm.  Take over-the-counter and prescription medicines only as told by your health care provider.  If directed, apply ice to the area: ? Put ice in a plastic bag. ? Place a towel between your skin and the bag. ? Leave the ice on for 20 minutes, 2-3 times per day. Stop applying ice if it does not help with the pain.  If you were given a shoulder sling or immobilizer: ? Wear it as told. ? Remove it to shower or bathe. ? Move your arm as little as possible, but keep your hand moving to prevent swelling.  Contact a health care provider if:  Your pain gets worse.  Your pain is not relieved with medicines.  New pain develops in your arm, hand, or fingers. Get help right away if:  Your arm, hand, or fingers: ? Tingle. ? Become numb. ? Become swollen. ? Become painful. ? Turn white or blue. This information is not intended to replace advice given to you by your health care provider. Make sure you discuss any questions you have with your health care provider. Document Released: 05/29/2005 Document Revised: 04/14/2016 Document Reviewed: 12/12/2014 Elsevier Interactive Patient Education  [employer].
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
## 2017-12-29 NOTE — Assessment & Plan Note (Signed)
Encouraged heart healthy diet, increase exercise, avoid trans fats, consider a krill oil cap daily 

## 2017-12-29 NOTE — Assessment & Plan Note (Signed)
Had an incident with a refrigerator fell on his leg recently, has an appointment with ortho soon will discuss need to image due to worsening pain and swelling. Still able to ambulate but hurts some, is wearing knee brace

## 2018-01-01 ENCOUNTER — Ambulatory Visit (INDEPENDENT_AMBULATORY_CARE_PROVIDER_SITE_OTHER): Payer: BLUE CROSS/BLUE SHIELD | Admitting: Orthopedic Surgery

## 2018-01-01 ENCOUNTER — Encounter (INDEPENDENT_AMBULATORY_CARE_PROVIDER_SITE_OTHER): Payer: Self-pay | Admitting: Orthopedic Surgery

## 2018-01-01 DIAGNOSIS — M25462 Effusion, left knee: Secondary | ICD-10-CM

## 2018-01-03 ENCOUNTER — Encounter (INDEPENDENT_AMBULATORY_CARE_PROVIDER_SITE_OTHER): Payer: Self-pay | Admitting: Orthopedic Surgery
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
## 2018-01-14 ENCOUNTER — Encounter (INDEPENDENT_AMBULATORY_CARE_PROVIDER_SITE_OTHER): Payer: BLUE CROSS/BLUE SHIELD

## 2018-01-20 ENCOUNTER — Encounter (INDEPENDENT_AMBULATORY_CARE_PROVIDER_SITE_OTHER): Payer: BLUE CROSS/BLUE SHIELD

## 2018-01-28 ENCOUNTER — Ambulatory Visit (INDEPENDENT_AMBULATORY_CARE_PROVIDER_SITE_OTHER): Payer: BLUE CROSS/BLUE SHIELD | Admitting: Family Medicine

## 2018-05-18 ENCOUNTER — Ambulatory Visit (INDEPENDENT_AMBULATORY_CARE_PROVIDER_SITE_OTHER): Payer: BLUE CROSS/BLUE SHIELD | Admitting: Orthopedic Surgery

## 2018-05-18 ENCOUNTER — Ambulatory Visit (INDEPENDENT_AMBULATORY_CARE_PROVIDER_SITE_OTHER): Payer: Self-pay

## 2018-05-18 DIAGNOSIS — G8929 Other chronic pain: Secondary | ICD-10-CM | POA: Diagnosis not present

## 2018-05-18 DIAGNOSIS — M25562 Pain in left knee: Secondary | ICD-10-CM

## 2018-05-19 ENCOUNTER — Encounter (INDEPENDENT_AMBULATORY_CARE_PROVIDER_SITE_OTHER): Payer: Self-pay | Admitting: Orthopedic Surgery
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
## 2018-06-25 ENCOUNTER — Encounter: Payer: Self-pay | Admitting: Family Medicine

## 2018-08-06 ENCOUNTER — Encounter (HOSPITAL_BASED_OUTPATIENT_CLINIC_OR_DEPARTMENT_OTHER): Payer: Self-pay | Admitting: *Deleted

## 2018-08-06 ENCOUNTER — Emergency Department (HOSPITAL_BASED_OUTPATIENT_CLINIC_OR_DEPARTMENT_OTHER)
Admission: EM | Admit: 2018-08-06 | Discharge: 2018-08-06 | Disposition: A | Discharge status: Home / Self Care | Payer: BLUE CROSS/BLUE SHIELD | Attending: Emergency Medicine | Admitting: Emergency Medicine

## 2018-08-06 ENCOUNTER — Other Ambulatory Visit: Payer: Self-pay

## 2018-08-06 DIAGNOSIS — H6991 Unspecified Eustachian tube disorder, right ear: Secondary | ICD-10-CM | POA: Diagnosis not present

## 2018-08-06 DIAGNOSIS — H6981 Other specified disorders of Eustachian tube, right ear: Secondary | ICD-10-CM | POA: Insufficient documentation

## 2018-08-06 DIAGNOSIS — R51 Headache: Secondary | ICD-10-CM | POA: Diagnosis not present

## 2018-08-06 MED ORDER — OXYMETAZOLINE HCL 0.05 % NA SOLN
1.0000 | Freq: Once | NASAL | Status: AC
  Administered 2018-08-06: 1 via NASAL
  Filled 2018-08-06: qty 15

## 2018-08-06 MED ORDER — LORATADINE-PSEUDOEPHEDRINE ER 5-120 MG PO TB12: 1.0000 | ORAL_TABLET | Freq: Two times a day (BID) | ORAL | 0 refills | Status: AC

## 2018-08-06 MED FILL — ALAVERT D-12 ALLERGY-SINUS: 5-120 | 24 days supply | Qty: 48 | Fill #0
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
## 2018-08-06 NOTE — ED Triage Notes (Signed)
Pt reports sinus pain and pressure to right side of his face x 2 days, also right ear pain. Denies cough, fever or other c/o.

## 2018-08-13 ENCOUNTER — Encounter: Payer: Self-pay | Admitting: Family Medicine

## 2018-08-13 ENCOUNTER — Ambulatory Visit (INDEPENDENT_AMBULATORY_CARE_PROVIDER_SITE_OTHER): Payer: BLUE CROSS/BLUE SHIELD | Admitting: Family Medicine

## 2018-08-13 VITALS — BP 102/62 | HR 73 | Temp 98.0°F | Resp 18 | Ht 70.0 in | Wt 283.8 lb

## 2018-08-13 DIAGNOSIS — Z23 Encounter for immunization: Secondary | ICD-10-CM

## 2018-08-13 DIAGNOSIS — R51 Headache: Secondary | ICD-10-CM | POA: Diagnosis not present

## 2018-08-13 DIAGNOSIS — H6690 Otitis media, unspecified, unspecified ear: Secondary | ICD-10-CM

## 2018-08-13 DIAGNOSIS — E782 Mixed hyperlipidemia: Secondary | ICD-10-CM | POA: Diagnosis not present

## 2018-08-13 DIAGNOSIS — E781 Pure hyperglyceridemia: Secondary | ICD-10-CM | POA: Diagnosis not present

## 2018-08-13 DIAGNOSIS — R739 Hyperglycemia, unspecified: Secondary | ICD-10-CM | POA: Diagnosis not present

## 2018-08-13 DIAGNOSIS — J0100 Acute maxillary sinusitis, unspecified: Secondary | ICD-10-CM

## 2018-08-13 DIAGNOSIS — Z125 Encounter for screening for malignant neoplasm of prostate: Secondary | ICD-10-CM

## 2018-08-13 DIAGNOSIS — Z1211 Encounter for screening for malignant neoplasm of colon: Secondary | ICD-10-CM

## 2018-08-13 DIAGNOSIS — R519 Headache, unspecified: Secondary | ICD-10-CM

## 2018-08-13 DIAGNOSIS — Z0000 Encounter for general adult medical examination without abnormal findings: Secondary | ICD-10-CM | POA: Diagnosis not present

## 2018-08-13 MED ORDER — CEFDINIR 300 MG PO CAPS: 300.0000 mg | ORAL_CAPSULE | Freq: Two times a day (BID) | ORAL | 0 refills | Status: AC

## 2018-08-13 NOTE — Patient Instructions (Addendum)
Encouraged increased rest and hydration, add probiotics, zinc such as Coldeze or Xicam. Treat fevers as needed. Plain mucinex twice a day, vitamin C 500 to 1000 mg and elderberry liquid. 60-80 ounces of fluids daily.   Shingrix is the new shingles shot 2 shots over 2-6 months, call insurance regarding coverage and document and then call for nurse appt to get shot.  Trigeminal Neuralgia Trigeminal neuralgia is a nerve disorder that causes attacks of severe facial pain. The attacks last from a few seconds to several minutes. They can happen for days, weeks, or months and then go away for months or years. Trigeminal neuralgia is also called tic douloureux. What are the causes? This condition is caused by damage to a nerve in the face that is called the trigeminal nerve. An attack can be triggered by:  Talking.  Chewing.  Putting on makeup.  Washing your face.  Shaving your face.  Brushing your teeth.  Touching your face.  What increases the risk? This condition is more likely to develop in:  Women.  People who are 72 years of age or older.  What are the signs or symptoms? The main symptom of this condition is pain in the jaw, lips, eyes, nose, scalp, forehead, and face. The pain may be intense, stabbing, electric, or shock-like. How is this diagnosed? This condition is diagnosed with a physical exam. A CT scan or MRI may be done to rule out other conditions that can cause facial pain. How is this treated? This condition may be treated with:  Avoiding the things that trigger your attacks.  Pain medicine.  Surgery. This may be done in severe cases if other medical treatment does not provide relief.  Follow these instructions at home:  Take over-the-counter and prescription medicines only as told by your health care provider.  If you wish to get pregnant, talk with your health care provider before you start trying to get pregnant.  Avoid the things that trigger your attacks.  It may help to: ? Chew on the unaffected side of your mouth. ? Avoid touching your face. ? Avoid blasts of hot or cold air. Contact a health care provider if:  Your pain medicine is not helping.  You develop new, unexplained symptoms, such as: ? Double vision. ? Facial weakness. ? Changes in hearing or balance.  You become pregnant. Get help right away if:  Your pain is unbearable, and your pain medicine does not help. This information is not intended to replace advice given to you by your health care provider. Make sure you discuss any questions you have with your health care provider. Document Released: 08/16/2000 Document Revised: 04/21/2016 Document Reviewed: 12/12/2014 Elsevier Interactive Patient Education  2018 [place] Years, Male Preventive care refers to lifestyle choices and visits with your health care provider that can promote health and wellness. What does preventive care include?  A yearly physical exam. This is also called an annual well check.  Dental exams once or twice a year.  Routine eye exams. Ask your health care provider how often you should have your eyes checked.  Personal lifestyle choices, including: ? Daily care of your teeth and gums. ? Regular physical activity. ? Eating a healthy diet. ? Avoiding tobacco and drug use. ? Limiting alcohol use. ? Practicing safe sex. ? Taking low-dose aspirin every day starting at age 28. What happens during an annual well check? The services and screenings done by your health care provider during your  annual well check will depend on your age, overall health, lifestyle risk factors, and family history of disease. Counseling Your health care provider may ask you questions about your:  Alcohol use.  Tobacco use.  Drug use.  Emotional well-being.  Home and relationship well-being.  Sexual activity.  Eating habits.  Work and work Statistician.  Screening You may have  the following tests or measurements:  Height, weight, and BMI.  Blood pressure.  Lipid and cholesterol levels. These may be checked every 5 years, or more frequently if you are over 18 years old.  Skin check.  Lung cancer screening. You may have this screening every year starting at age 28 if you have a 30-pack-year history of smoking and currently smoke or have quit within the past 15 years.  Fecal occult blood test (FOBT) of the stool. You may have this test every year starting at age 22.  Flexible sigmoidoscopy or colonoscopy. You may have a sigmoidoscopy every 5 years or a colonoscopy every 10 years starting at age 62.  Prostate cancer screening. Recommendations will vary depending on your family history and other risks.  Hepatitis C blood test.  Hepatitis B blood test.  Sexually transmitted disease (STD) testing.  Diabetes screening. This is done by checking your blood sugar (glucose) after you have not eaten for a while (fasting). You may have this done every 1-3 years.  Discuss your test results, treatment options, and if necessary, the need for more tests with your health care provider. Vaccines Your health care provider may recommend certain vaccines, such as:  Influenza vaccine. This is recommended every year.  Tetanus, diphtheria, and acellular pertussis (Tdap, Td) vaccine. You may need a Td booster every 10 years.  Varicella vaccine. You may need this if you have not been vaccinated.  Zoster vaccine. You may need this after age 51.  Measles, mumps, and rubella (MMR) vaccine. You may need at least one dose of MMR if you were born in 1957 or later. You may also need a second dose.  Pneumococcal 13-valent conjugate (PCV13) vaccine. You may need this if you have certain conditions and have not been vaccinated.  Pneumococcal polysaccharide (PPSV23) vaccine. You may need one or two doses if you smoke cigarettes or if you have certain conditions.  Meningococcal  vaccine. You may need this if you have certain conditions.  Hepatitis A vaccine. You may need this if you have certain conditions or if you travel or work in places where you may be exposed to hepatitis A.  Hepatitis B vaccine. You may need this if you have certain conditions or if you travel or work in places where you may be exposed to hepatitis B.  Haemophilus influenzae type b (Hib) vaccine. You may need this if you have certain risk factors.  Talk to your health care provider about which screenings and vaccines you need and how often you need them. This information is not intended to replace advice given to you by your health care provider. Make sure you discuss any questions you have with your health care provider. Document Released: 09/15/2015 Document Revised: 05/08/2016 Document Reviewed: 06/20/2015 Elsevier Interactive Patient Education  [employer].

## 2018-08-14 LAB — TSH: TSH: 1.53 u[IU]/mL (ref 0.35–4.50)

## 2018-08-14 LAB — LIPID PANEL
CHOLESTEROL: 159 mg/dL (ref 0–200)
HDL: 33.6 mg/dL — AB (ref >39.00)
NonHDL: 125.19
Total CHOL/HDL Ratio: 5
Triglycerides: 306 mg/dL — ABNORMAL HIGH (ref 0.0–149.0)
VLDL: 61.2 mg/dL — AB (ref 0.0–40.0)

## 2018-08-14 LAB — COMPREHENSIVE METABOLIC PANEL
ALBUMIN: 4.1 g/dL (ref 3.5–5.2)
ALK PHOS: 72 U/L (ref 39–117)
ALT: 12 U/L (ref 0–53)
AST: 12 U/L (ref 0–37)
BUN: 11 mg/dL (ref 6–23)
CALCIUM: 9 mg/dL (ref 8.4–10.5)
CO2: 27 mEq/L (ref 19–32)
Chloride: 103 mEq/L (ref 96–112)
Creatinine, Ser: 1.05 mg/dL (ref 0.40–1.50)
GFR: 95.52 mL/min (ref >60.00)
Glucose, Bld: 87 mg/dL (ref 70–99)
POTASSIUM: 3.9 meq/L (ref 3.5–5.1)
Sodium: 139 mEq/L (ref 135–145)
TOTAL PROTEIN: 7.1 g/dL (ref 6.0–8.3)
Total Bilirubin: 0.6 mg/dL (ref 0.2–1.2)

## 2018-08-14 LAB — CBC WITH DIFFERENTIAL/PLATELET
Basophils Absolute: 0.1 10*3/uL (ref 0.0–0.1)
Basophils Relative: 0.9 % (ref 0.0–3.0)
Eosinophils Absolute: 0.2 10*3/uL (ref 0.0–0.7)
Eosinophils Relative: 2.4 % (ref 0.0–5.0)
HCT: 47.6 % (ref 39.0–52.0)
HEMOGLOBIN: 15.6 g/dL (ref 13.0–17.0)
LYMPHS ABS: 3.5 10*3/uL (ref 0.7–4.0)
Lymphocytes Relative: 39.1 % (ref 12.0–46.0)
MCHC: 32.7 g/dL (ref 30.0–36.0)
MCV: 88 fl (ref 78.0–100.0)
MONOS PCT: 10.8 % (ref 3.0–12.0)
Monocytes Absolute: 1 10*3/uL (ref 0.1–1.0)
Neutro Abs: 4.2 10*3/uL (ref 1.4–7.7)
Neutrophils Relative %: 46.8 % (ref 43.0–77.0)
Platelets: 245 10*3/uL (ref 150.0–400.0)
RBC: 5.41 Mil/uL (ref 4.22–5.81)
RDW: 13.6 % (ref 11.5–15.5)
WBC: 9 10*3/uL (ref 4.0–10.5)

## 2018-08-14 LAB — PSA: PSA: 2.24 ng/mL (ref 0.10–4.00)

## 2018-08-14 LAB — LDL CHOLESTEROL, DIRECT: Direct LDL: 84 mg/dL

## 2018-08-14 LAB — SEDIMENTATION RATE: Sed Rate: 4 mm/hr (ref 0–20)

## 2018-08-14 LAB — HEMOGLOBIN A1C: HEMOGLOBIN A1C: 6 % (ref 4.6–6.5)

## 2018-08-16 DIAGNOSIS — J0100 Acute maxillary sinusitis, unspecified: Secondary | ICD-10-CM | POA: Insufficient documentation

## 2018-08-16 NOTE — Assessment & Plan Note (Addendum)
Patient encouraged to maintain heart healthy diet, regular exercise, adequate sleep. Consider daily probiotics. Take medications as prescribed. Flu shot given, encouraged Shingrix. Given and reviewed copy of ACP documents from [employer] of [employer] and encouraged to complete and return

## 2018-08-16 NOTE — Assessment & Plan Note (Signed)
Encouraged DASH diet, decrease po intake and increase exercise as tolerated. Needs 7-8 hours of sleep nightly. Avoid trans fats, eat small, frequent meals every 4-5 hours with lean proteins, complex carbs and healthy fats. Minimize simple carbs 
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
## 2018-08-16 NOTE — Assessment & Plan Note (Signed)
Encouraged heart healthy diet, increase exercise, avoid trans fats, consider a krill oil cap daily 

## 2018-08-16 NOTE — Assessment & Plan Note (Signed)
UTD

## 2018-08-16 NOTE — Assessment & Plan Note (Signed)
hgba1c acceptable, minimize simple carbs. Increase exercise as tolerated.  

## 2018-08-16 NOTE — Assessment & Plan Note (Addendum)
Recently seen in ER with ear pain, now persistent and worsening and increasing right sided facial pain and pressure. Started on Cefdinir and probiotics. Report if worse.

## 2018-08-17 MED ORDER — ATORVASTATIN CALCIUM 10 MG PO TABS: 10.0000 mg | ORAL_TABLET | Freq: Every day | ORAL | 3 refills | Status: DC

## 2018-08-17 NOTE — Addendum Note (Signed)
Addended by: Crissie SicklesARTER, Rand Etchison A on: 08/17/2018 05:01 PM   Modules accepted: Orders

## 2018-11-23 ENCOUNTER — Telehealth: Payer: BLUE CROSS/BLUE SHIELD | Admitting: Family

## 2018-11-23 DIAGNOSIS — R059 Cough, unspecified: Secondary | ICD-10-CM

## 2018-11-23 DIAGNOSIS — R05 Cough: Secondary | ICD-10-CM

## 2018-11-23 MED ORDER — BENZONATATE 100 MG PO CAPS: 100.0000 mg | ORAL_CAPSULE | Freq: Two times a day (BID) | ORAL | 0 refills | Status: DC | PRN

## 2018-11-23 NOTE — Progress Notes (Signed)
E-Visit for Corona Virus Screening  Based on your current symptoms, you may very well have the virus, however your symptoms are mild. Currently, not all patients are being tested. If the symptoms are mild and there is not a known exposure, performing the test is not indicated.  Coronavirus disease 2019 (COVID-19) is a respiratory illness that can spread from person to person. The virus that causes COVID-19 is a new virus that was first identified in the country of [place] but is now found in multiple other countries and has spread to the [place].  Symptoms associated with the virus are mild to severe fever, cough, and shortness of breath. There is currently no vaccine to protect against COVID-19, and there is no specific antiviral treatment for the virus.   To be considered HIGH RISK for Coronavirus (COVID-19), you have to meet the following criteria:  . Traveled to [place], [place], [place], [place] or [place]; or in the [place] to [place], [place], [place], or [place]; and have fever, cough, and shortness of breath within the last 2 weeks of travel OR  . Been in close contact with a person diagnosed with COVID-19 within the last 2 weeks and have fever, cough, and shortness of breath  . IF YOU DO NOT MEET THESE CRITERIA, YOU ARE CONSIDERED LOW RISK FOR COVID-19.   It is vitally important that if you feel that you have an infection such as this virus or any other virus that you stay home and away from places where you may spread it to others.  You should self-quarantine for 14 days if you have symptoms that could potentially be coronavirus and avoid contact with people age 65 and older.   You can use medication such as A prescription cough medication called Tessalon Perles 100 mg. You may take 1-2 capsules every 8 hours as needed for cough  You may also take acetaminophen (Tylenol) as needed for fever.   Reduce your risk of any infection by using the same precautions used for  avoiding the common cold or flu:  . Wash your hands often with soap and warm water for at least 20 seconds.  If soap and water are not readily available, use an alcohol-based hand sanitizer with at least 60% alcohol.  . If coughing or sneezing, cover your mouth and nose by coughing or sneezing into the elbow areas of your shirt or coat, into a tissue or into your sleeve (not your hands). . Avoid shaking hands with others and consider head nods or verbal greetings only. . Avoid touching your eyes, nose, or mouth with unwashed hands.  . Avoid close contact with people who are sick. . Avoid places or events with large numbers of people in one location, like concerts or sporting events. . Carefully consider travel plans you have or are making. . If you are planning any travel outside or inside the [place], visit the CDC's Travelers' Health webpage for the latest health notices. . If you have some symptoms but not all symptoms, continue to monitor at home and seek medical attention if your symptoms worsen. . If you are having a medical emergency, call 911.  HOME CARE . Only take medications as instructed by your medical team. . Drink plenty of fluids and get plenty of rest. . A steam or ultrasonic humidifier can help if you have congestion.   GET HELP RIGHT AWAY IF: . You develop worsening fever. . You become short of breath . You cough   up blood. . Your symptoms become more severe MAKE SURE YOU   Understand these instructions.  Will watch your condition.  Will get help right away if you are not doing well or get worse.  Your e-visit answers were reviewed by a board certified advanced clinical practitioner to complete your personal care plan.  Depending on the condition, your plan could have included both over the counter or prescription medications.  If there is a problem please reply once you have received a response from your provider. Your safety is important to us.  If you have drug  allergies check your prescription carefully.    You can use MyChart to ask questions about today's visit, request a non-urgent call back, or ask for a work or school excuse for 24 hours related to this e-Visit. If it has been greater than 24 hours you will need to follow up with your provider, or enter a new e-Visit to address those concerns. You will get an e-mail in the next two days asking about your experience.  I hope that your e-visit has been valuable and will speed your recovery. Thank you for using e-visits.    

## 2019-01-17 ENCOUNTER — Inpatient Hospital Stay (HOSPITAL_BASED_OUTPATIENT_CLINIC_OR_DEPARTMENT_OTHER)
Admission: EM | Admit: 2019-01-17 | Discharge: 2019-01-21 | DRG: 419 | Disposition: A | Discharge status: Home / Self Care | Payer: BLUE CROSS/BLUE SHIELD | Attending: Surgery | Admitting: Surgery

## 2019-01-17 ENCOUNTER — Emergency Department (HOSPITAL_BASED_OUTPATIENT_CLINIC_OR_DEPARTMENT_OTHER): Payer: BLUE CROSS/BLUE SHIELD

## 2019-01-17 ENCOUNTER — Encounter (HOSPITAL_BASED_OUTPATIENT_CLINIC_OR_DEPARTMENT_OTHER): Payer: Self-pay | Admitting: Emergency Medicine

## 2019-01-17 ENCOUNTER — Other Ambulatory Visit: Payer: Self-pay

## 2019-01-17 DIAGNOSIS — K8044 Calculus of bile duct with chronic cholecystitis without obstruction: Secondary | ICD-10-CM | POA: Diagnosis not present

## 2019-01-17 DIAGNOSIS — E669 Obesity, unspecified: Secondary | ICD-10-CM | POA: Diagnosis not present

## 2019-01-17 DIAGNOSIS — E785 Hyperlipidemia, unspecified: Secondary | ICD-10-CM | POA: Diagnosis not present

## 2019-01-17 DIAGNOSIS — K8020 Calculus of gallbladder without cholecystitis without obstruction: Secondary | ICD-10-CM | POA: Diagnosis not present

## 2019-01-17 DIAGNOSIS — K8042 Calculus of bile duct with acute cholecystitis without obstruction: Secondary | ICD-10-CM | POA: Diagnosis not present

## 2019-01-17 DIAGNOSIS — Z791 Long term (current) use of non-steroidal anti-inflammatories (NSAID): Secondary | ICD-10-CM

## 2019-01-17 DIAGNOSIS — R1011 Right upper quadrant pain: Secondary | ICD-10-CM | POA: Diagnosis not present

## 2019-01-17 DIAGNOSIS — G4730 Sleep apnea, unspecified: Secondary | ICD-10-CM | POA: Diagnosis present

## 2019-01-17 DIAGNOSIS — R1013 Epigastric pain: Secondary | ICD-10-CM

## 2019-01-17 DIAGNOSIS — J0100 Acute maxillary sinusitis, unspecified: Secondary | ICD-10-CM | POA: Diagnosis not present

## 2019-01-17 DIAGNOSIS — R7989 Other specified abnormal findings of blood chemistry: Secondary | ICD-10-CM

## 2019-01-17 DIAGNOSIS — Z8249 Family history of ischemic heart disease and other diseases of the circulatory system: Secondary | ICD-10-CM | POA: Diagnosis not present

## 2019-01-17 DIAGNOSIS — Z419 Encounter for procedure for purposes other than remedying health state, unspecified: Secondary | ICD-10-CM

## 2019-01-17 DIAGNOSIS — R932 Abnormal findings on diagnostic imaging of liver and biliary tract: Secondary | ICD-10-CM

## 2019-01-17 DIAGNOSIS — Z6839 Body mass index (BMI) 39.0-39.9, adult: Secondary | ICD-10-CM

## 2019-01-17 DIAGNOSIS — Z8042 Family history of malignant neoplasm of prostate: Secondary | ICD-10-CM

## 2019-01-17 DIAGNOSIS — K8050 Calculus of bile duct without cholangitis or cholecystitis without obstruction: Secondary | ICD-10-CM | POA: Diagnosis not present

## 2019-01-17 DIAGNOSIS — Z823 Family history of stroke: Secondary | ICD-10-CM | POA: Diagnosis not present

## 2019-01-17 DIAGNOSIS — Z1159 Encounter for screening for other viral diseases: Secondary | ICD-10-CM

## 2019-01-17 DIAGNOSIS — K319 Disease of stomach and duodenum, unspecified: Secondary | ICD-10-CM | POA: Diagnosis not present

## 2019-01-17 DIAGNOSIS — K8067 Calculus of gallbladder and bile duct with acute and chronic cholecystitis with obstruction: Secondary | ICD-10-CM | POA: Diagnosis not present

## 2019-01-17 DIAGNOSIS — R109 Unspecified abdominal pain: Secondary | ICD-10-CM | POA: Diagnosis not present

## 2019-01-17 DIAGNOSIS — Z79891 Long term (current) use of opiate analgesic: Secondary | ICD-10-CM

## 2019-01-17 DIAGNOSIS — R748 Abnormal levels of other serum enzymes: Secondary | ICD-10-CM | POA: Diagnosis not present

## 2019-01-17 DIAGNOSIS — Z803 Family history of malignant neoplasm of breast: Secondary | ICD-10-CM | POA: Diagnosis not present

## 2019-01-17 DIAGNOSIS — K8000 Calculus of gallbladder with acute cholecystitis without obstruction: Secondary | ICD-10-CM | POA: Diagnosis not present

## 2019-01-17 DIAGNOSIS — K2960 Other gastritis without bleeding: Secondary | ICD-10-CM | POA: Diagnosis present

## 2019-01-17 DIAGNOSIS — Z86718 Personal history of other venous thrombosis and embolism: Secondary | ICD-10-CM

## 2019-01-17 DIAGNOSIS — K259 Gastric ulcer, unspecified as acute or chronic, without hemorrhage or perforation: Secondary | ICD-10-CM | POA: Diagnosis not present

## 2019-01-17 DIAGNOSIS — Z03818 Encounter for observation for suspected exposure to other biological agents ruled out: Secondary | ICD-10-CM | POA: Diagnosis not present

## 2019-01-17 DIAGNOSIS — K8010 Calculus of gallbladder with chronic cholecystitis without obstruction: Secondary | ICD-10-CM | POA: Diagnosis not present

## 2019-01-17 DIAGNOSIS — E7800 Pure hypercholesterolemia, unspecified: Secondary | ICD-10-CM | POA: Diagnosis not present

## 2019-01-17 DIAGNOSIS — E781 Pure hyperglyceridemia: Secondary | ICD-10-CM | POA: Diagnosis not present

## 2019-01-17 DIAGNOSIS — E782 Mixed hyperlipidemia: Secondary | ICD-10-CM | POA: Diagnosis not present

## 2019-01-17 DIAGNOSIS — Z79899 Other long term (current) drug therapy: Secondary | ICD-10-CM | POA: Diagnosis not present

## 2019-01-17 DIAGNOSIS — K839 Disease of biliary tract, unspecified: Secondary | ICD-10-CM | POA: Diagnosis not present

## 2019-01-17 DIAGNOSIS — K810 Acute cholecystitis: Secondary | ICD-10-CM | POA: Diagnosis present

## 2019-01-17 DIAGNOSIS — R945 Abnormal results of liver function studies: Secondary | ICD-10-CM | POA: Diagnosis not present

## 2019-01-17 LAB — COMPREHENSIVE METABOLIC PANEL
ALT: 21 U/L (ref 0–44)
AST: 19 U/L (ref 15–41)
Albumin: 4.3 g/dL (ref 3.5–5.0)
Alkaline Phosphatase: 80 U/L (ref 38–126)
Anion gap: 8 (ref 5–15)
BUN: 10 mg/dL (ref 6–20)
CO2: 26 mmol/L (ref 22–32)
Calcium: 9.2 mg/dL (ref 8.9–10.3)
Chloride: 102 mmol/L (ref 98–111)
Creatinine, Ser: 0.84 mg/dL (ref 0.61–1.24)
GFR calc Af Amer: >60 mL/min (ref >60)
GFR calc non Af Amer: >60 mL/min (ref >60)
Glucose, Bld: 111 mg/dL — ABNORMAL HIGH (ref 70–99)
Potassium: 4 mmol/L (ref 3.5–5.1)
Sodium: 136 mmol/L (ref 135–145)
Total Bilirubin: 0.7 mg/dL (ref 0.3–1.2)
Total Protein: 8.9 g/dL — ABNORMAL HIGH (ref 6.5–8.1)

## 2019-01-17 LAB — URINALYSIS, ROUTINE W REFLEX MICROSCOPIC
Bilirubin Urine: NEGATIVE
Glucose, UA: NEGATIVE mg/dL
Hgb urine dipstick: NEGATIVE
Ketones, ur: NEGATIVE mg/dL
Leukocytes,Ua: NEGATIVE
Nitrite: NEGATIVE
Protein, ur: NEGATIVE mg/dL
Specific Gravity, Urine: 1.025 (ref 1.005–1.030)
pH: 7.5 (ref 5.0–8.0)

## 2019-01-17 LAB — CBC WITH DIFFERENTIAL/PLATELET
Abs Immature Granulocytes: 0.04 10*3/uL (ref 0.00–0.07)
Basophils Absolute: 0 10*3/uL (ref 0.0–0.1)
Basophils Relative: 0 %
Eosinophils Absolute: 0 10*3/uL (ref 0.0–0.5)
Eosinophils Relative: 0 %
HCT: 52.4 % — ABNORMAL HIGH (ref 39.0–52.0)
Hemoglobin: 16.2 g/dL (ref 13.0–17.0)
Immature Granulocytes: 0 %
Lymphocytes Relative: 14 %
Lymphs Abs: 1.7 10*3/uL (ref 0.7–4.0)
MCH: 27.9 pg (ref 26.0–34.0)
MCHC: 30.9 g/dL (ref 30.0–36.0)
MCV: 90.3 fL (ref 80.0–100.0)
Monocytes Absolute: 0.4 10*3/uL (ref 0.1–1.0)
Monocytes Relative: 3 %
Neutro Abs: 10.6 10*3/uL — ABNORMAL HIGH (ref 1.7–7.7)
Neutrophils Relative %: 83 %
Platelets: 263 10*3/uL (ref 150–400)
RBC: 5.8 MIL/uL (ref 4.22–5.81)
RDW: 12.8 % (ref 11.5–15.5)
WBC: 12.8 10*3/uL — ABNORMAL HIGH (ref 4.0–10.5)
nRBC: 0 % (ref 0.0–0.2)

## 2019-01-17 LAB — LIPASE, BLOOD: Lipase: 38 U/L (ref 11–51)

## 2019-01-17 LAB — SARS CORONAVIRUS 2 BY RT PCR (HOSPITAL ORDER, PERFORMED IN ~~LOC~~ HOSPITAL LAB): SARS Coronavirus 2: NEGATIVE

## 2019-01-17 MED ORDER — ONDANSETRON 4 MG PO TBDP: 4.0000 mg | ORAL_TABLET | Freq: Four times a day (QID) | ORAL | Status: DC | PRN

## 2019-01-17 MED ORDER — HYDROMORPHONE HCL 1 MG/ML IJ SOLN: 1.0000 mg | INTRAMUSCULAR | Status: DC | PRN

## 2019-01-17 MED ORDER — ONDANSETRON HCL 4 MG/2ML IJ SOLN: 4.0000 mg | Freq: Four times a day (QID) | INTRAMUSCULAR | Status: DC | PRN

## 2019-01-17 MED ORDER — SODIUM CHLORIDE 0.9 % IV BOLUS
1000.0000 mL | Freq: Once | INTRAVENOUS | Status: AC
  Administered 2019-01-17: 1000 mL via INTRAVENOUS

## 2019-01-17 MED ORDER — OXYCODONE HCL 5 MG PO TABS
5.0000 mg | ORAL_TABLET | ORAL | Status: DC | PRN
  Administered 2019-01-18: 10 mg via ORAL
  Filled 2019-01-17: qty 2

## 2019-01-17 MED ORDER — TRAMADOL HCL 50 MG PO TABS: 50.0000 mg | ORAL_TABLET | Freq: Four times a day (QID) | ORAL | Status: DC | PRN

## 2019-01-17 MED ORDER — SODIUM CHLORIDE 0.9 % IV SOLN
2.0000 g | INTRAVENOUS | Status: DC
  Administered 2019-01-17 – 2019-01-18 (×2): 2 g via INTRAVENOUS
  Filled 2019-01-17 (×2): qty 2

## 2019-01-17 MED ORDER — ENOXAPARIN SODIUM 40 MG/0.4ML ~~LOC~~ SOLN
40.0000 mg | SUBCUTANEOUS | Status: DC
  Administered 2019-01-17 – 2019-01-20 (×3): 40 mg via SUBCUTANEOUS
  Filled 2019-01-17 (×3): qty 0.4

## 2019-01-17 MED ORDER — MORPHINE SULFATE (PF) 4 MG/ML IV SOLN
4.0000 mg | Freq: Once | INTRAVENOUS | Status: AC
  Administered 2019-01-17: 4 mg via INTRAVENOUS
  Filled 2019-01-17: qty 1

## 2019-01-17 MED ORDER — DIPHENHYDRAMINE HCL 50 MG/ML IJ SOLN: 25.0000 mg | Freq: Four times a day (QID) | INTRAMUSCULAR | Status: DC | PRN

## 2019-01-17 MED ORDER — DIPHENHYDRAMINE HCL 25 MG PO CAPS: 25.0000 mg | ORAL_CAPSULE | Freq: Four times a day (QID) | ORAL | Status: DC | PRN

## 2019-01-17 MED ORDER — ONDANSETRON HCL 4 MG/2ML IJ SOLN
4.0000 mg | Freq: Once | INTRAMUSCULAR | Status: AC
  Administered 2019-01-17: 4 mg via INTRAVENOUS
  Filled 2019-01-17: qty 2

## 2019-01-17 MED ORDER — POTASSIUM CHLORIDE IN NACL 20-0.9 MEQ/L-% IV SOLN
INTRAVENOUS | Status: DC
  Administered 2019-01-17 – 2019-01-18 (×2): via INTRAVENOUS
  Filled 2019-01-17 (×2): qty 1000
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
## 2019-01-17 NOTE — ED Notes (Signed)
[facility] arrived to transport pt to [facility] 

## 2019-01-17 NOTE — ED Triage Notes (Signed)
Pt arrived via EMS with c/o RUQ pain that started around 7am this morning when he awoke. Pt verbalizes 1 episode of vomiting pta after drinking a sip of beer, states he felt like he had to burp. Denies fever, denies diarrhea.

## 2019-01-17 NOTE — ED Notes (Signed)
ED Provider at bedside. 

## 2019-01-17 NOTE — ED Notes (Signed)
Urine culture collected 
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
## 2019-01-17 NOTE — ED Provider Notes (Signed)
Patient presents today in transfer from [facility], please see note by Yancey Flemings, PA-C for full H&P.  Briefly patient is here for epigastric and right upper quadrant pain.  Ultrasound was obtained at [facility] showing 8 mm stone in the neck of the gallbladder with sludge, positive Murphy sign.  After Magnus Ivan was consulted and reportedly requested patient be transferred here so he could evaluate here.  He reports that he has no pain currently.   Physical Exam  BP (!) 153/78 (BP Location: Right Arm)   Pulse 78   Temp 98.6 F (37 C) (Oral)   Resp 17   Ht 5\' 10"  (1.778 m)   Wt 124.7 kg   SpO2 97%   BMI 39.46 kg/m   Physical Exam Vitals signs and nursing note reviewed.  Constitutional:      General: He is not in acute distress. HENT:     Head: Normocephalic.  Cardiovascular:     Rate and Rhythm: Normal rate.  Abdominal:     Palpations: Abdomen is soft.     Tenderness: There is no abdominal tenderness.  Neurological:     General: No focal deficit present.     Mental Status: He is alert.     ED Course/Procedures   Clinical Course as of Jan 17 1803  Sun Jan 17, 2019  1247 Patient declines analgesia or antiemetics at this time.   [SJ]  1345 Pain and nausea are still well controlled.   [SJ]  [id number] Spoke with Dr. Magnus Ivan, general surgery. States patient can be transferred to the [facility] long ED.  Have them call when the patient arrives and Dr. Magnus Ivan will come evaluate the patient.  If patient is agreeable to surgery, this will likely occur tomorrow.  No antibiotics or other treatments are necessary at this time.   [SJ]  [id number] Spoke with Dr. Ranae Palms, EDP at [facility]. Agrees to accept patient.   [SJ]  1541 Patient reevaluated prior to transfer.  Pain is well controlled.   [SJ]  1730 Dr. Magnus Ivan is aware patient is here.    [EH]    Clinical Course User Index [EH] Cristina Gong, PA-C [SJ] Anselm Pancoast, PA-C    Procedures  MDM   Patient here for  evaluation by general surgery.  His pain is well controlled on arrival.  Order placed to page general surgery.  Spoke with Dr. Magnus Ivan who is aware that patient is here and will admit patient.  COVID testing ordered.      Cristina Gong, PA-C 01/17/19 1804    Gerhard Munch, MD 01/21/19 [id number]

## 2019-01-17 NOTE — ED Notes (Signed)
Patient transported to US 
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
## 2019-01-18 ENCOUNTER — Inpatient Hospital Stay (HOSPITAL_COMMUNITY): Payer: BLUE CROSS/BLUE SHIELD | Admitting: Anesthesiology

## 2019-01-18 ENCOUNTER — Inpatient Hospital Stay (HOSPITAL_COMMUNITY): Payer: BLUE CROSS/BLUE SHIELD

## 2019-01-18 ENCOUNTER — Encounter (HOSPITAL_COMMUNITY): Admission: EM | Disposition: A | Discharge status: Home / Self Care | Payer: Self-pay | Source: Home / Self Care

## 2019-01-18 ENCOUNTER — Encounter (HOSPITAL_COMMUNITY): Payer: Self-pay | Admitting: *Deleted

## 2019-01-18 HISTORY — PX: CHOLECYSTECTOMY: SHX55

## 2019-01-18 LAB — COMPREHENSIVE METABOLIC PANEL
ALT: 18 U/L (ref 0–44)
AST: 16 U/L (ref 15–41)
Albumin: 3.4 g/dL — ABNORMAL LOW (ref 3.5–5.0)
Alkaline Phosphatase: 68 U/L (ref 38–126)
Anion gap: 9 (ref 5–15)
BUN: 9 mg/dL (ref 6–20)
CO2: 25 mmol/L (ref 22–32)
Calcium: 8.2 mg/dL — ABNORMAL LOW (ref 8.9–10.3)
Chloride: 104 mmol/L (ref 98–111)
Creatinine, Ser: 0.97 mg/dL (ref 0.61–1.24)
GFR calc Af Amer: >60 mL/min (ref >60)
GFR calc non Af Amer: >60 mL/min (ref >60)
Glucose, Bld: 94 mg/dL (ref 70–99)
Potassium: 3.8 mmol/L (ref 3.5–5.1)
Sodium: 138 mmol/L (ref 135–145)
Total Bilirubin: 0.7 mg/dL (ref 0.3–1.2)
Total Protein: 7 g/dL (ref 6.5–8.1)

## 2019-01-18 LAB — SURGICAL PCR SCREEN
MRSA, PCR: NEGATIVE
Staphylococcus aureus: NEGATIVE

## 2019-01-18 LAB — CBC
HCT: 46 % (ref 39.0–52.0)
Hemoglobin: 14 g/dL (ref 13.0–17.0)
MCH: 28 pg (ref 26.0–34.0)
MCHC: 30.4 g/dL (ref 30.0–36.0)
MCV: 92 fL (ref 80.0–100.0)
Platelets: 223 10*3/uL (ref 150–400)
RBC: 5 MIL/uL (ref 4.22–5.81)
RDW: 13.1 % (ref 11.5–15.5)
WBC: 11.2 10*3/uL — ABNORMAL HIGH (ref 4.0–10.5)
nRBC: 0 % (ref 0.0–0.2)

## 2019-01-18 LAB — HIV ANTIBODY (ROUTINE TESTING W REFLEX): HIV Screen 4th Generation wRfx: NONREACTIVE

## 2019-01-18 SURGERY — LAPAROSCOPIC CHOLECYSTECTOMY WITH INTRAOPERATIVE CHOLANGIOGRAM
Anesthesia: General

## 2019-01-18 MED ORDER — SUGAMMADEX SODIUM 200 MG/2ML IV SOLN
INTRAVENOUS | Status: DC | PRN
  Administered 2019-01-18: 300 mg via INTRAVENOUS

## 2019-01-18 MED ORDER — SUCCINYLCHOLINE CHLORIDE 200 MG/10ML IV SOSY
PREFILLED_SYRINGE | INTRAVENOUS | Status: DC | PRN
  Administered 2019-01-18: 120 mg via INTRAVENOUS

## 2019-01-18 MED ORDER — FENTANYL CITRATE (PF) 100 MCG/2ML IJ SOLN
INTRAMUSCULAR | Status: AC
  Filled 2019-01-18: qty 2

## 2019-01-18 MED ORDER — IOHEXOL 300 MG/ML  SOLN
INTRAMUSCULAR | Status: DC | PRN
  Administered 2019-01-18: 40 mL via INTRAVENOUS

## 2019-01-18 MED ORDER — PROPOFOL 10 MG/ML IV BOLUS
INTRAVENOUS | Status: AC
  Filled 2019-01-18: qty 20

## 2019-01-18 MED ORDER — ONDANSETRON HCL 4 MG/2ML IJ SOLN
INTRAMUSCULAR | Status: AC
  Filled 2019-01-18: qty 2

## 2019-01-18 MED ORDER — KCL IN DEXTROSE-NACL 20-5-0.45 MEQ/L-%-% IV SOLN
INTRAVENOUS | Status: DC
  Administered 2019-01-18 – 2019-01-19 (×2): via INTRAVENOUS
  Filled 2019-01-18 (×2): qty 1000

## 2019-01-18 MED ORDER — HYDROMORPHONE HCL 1 MG/ML IJ SOLN
0.2500 mg | INTRAMUSCULAR | Status: DC | PRN
  Administered 2019-01-18 (×2): 0.5 mg via INTRAVENOUS

## 2019-01-18 MED ORDER — PHENYLEPHRINE 40 MCG/ML (10ML) SYRINGE FOR IV PUSH (FOR BLOOD PRESSURE SUPPORT)
PREFILLED_SYRINGE | INTRAVENOUS | Status: AC
  Filled 2019-01-18: qty 10

## 2019-01-18 MED ORDER — MIDAZOLAM HCL 2 MG/2ML IJ SOLN
INTRAMUSCULAR | Status: AC
  Filled 2019-01-18: qty 2

## 2019-01-18 MED ORDER — BUPIVACAINE-EPINEPHRINE (PF) 0.25% -1:200000 IJ SOLN
INTRAMUSCULAR | Status: AC
  Filled 2019-01-18: qty 30

## 2019-01-18 MED ORDER — HYDROMORPHONE HCL 1 MG/ML IJ SOLN
INTRAMUSCULAR | Status: AC
  Filled 2019-01-18: qty 1

## 2019-01-18 MED ORDER — LACTATED RINGERS IR SOLN
Status: DC | PRN
  Administered 2019-01-18: 1000 mL

## 2019-01-18 MED ORDER — LACTATED RINGERS IV SOLN
INTRAVENOUS | Status: DC
  Administered 2019-01-18: 08:00:00 via INTRAVENOUS

## 2019-01-18 MED ORDER — 0.9 % SODIUM CHLORIDE (POUR BTL) OPTIME
TOPICAL | Status: DC | PRN
  Administered 2019-01-18: 1000 mL

## 2019-01-18 MED ORDER — ROCURONIUM BROMIDE 10 MG/ML (PF) SYRINGE
PREFILLED_SYRINGE | INTRAVENOUS | Status: DC | PRN
  Administered 2019-01-18: 20 mg via INTRAVENOUS
  Administered 2019-01-18: 50 mg via INTRAVENOUS

## 2019-01-18 MED ORDER — MIDAZOLAM HCL 2 MG/2ML IJ SOLN
INTRAMUSCULAR | Status: DC | PRN
  Administered 2019-01-18: 2 mg via INTRAVENOUS

## 2019-01-18 MED ORDER — SUGAMMADEX SODIUM 500 MG/5ML IV SOLN
INTRAVENOUS | Status: AC
  Filled 2019-01-18: qty 5

## 2019-01-18 MED ORDER — KETOROLAC TROMETHAMINE 30 MG/ML IJ SOLN: 30.0000 mg | Freq: Once | INTRAMUSCULAR | Status: DC | PRN

## 2019-01-18 MED ORDER — PROPOFOL 10 MG/ML IV BOLUS
INTRAVENOUS | Status: DC | PRN
  Administered 2019-01-18: 200 mg via INTRAVENOUS

## 2019-01-18 MED ORDER — BUPIVACAINE-EPINEPHRINE 0.25% -1:200000 IJ SOLN
INTRAMUSCULAR | Status: DC | PRN
  Administered 2019-01-18: 30 mL

## 2019-01-18 MED ORDER — FENTANYL CITRATE (PF) 250 MCG/5ML IJ SOLN
INTRAMUSCULAR | Status: DC | PRN
  Administered 2019-01-18 (×4): 50 ug via INTRAVENOUS

## 2019-01-18 MED ORDER — PHENYLEPHRINE 40 MCG/ML (10ML) SYRINGE FOR IV PUSH (FOR BLOOD PRESSURE SUPPORT)
PREFILLED_SYRINGE | INTRAVENOUS | Status: DC | PRN
  Administered 2019-01-18 (×2): 120 ug via INTRAVENOUS

## 2019-01-18 MED ORDER — ROCURONIUM BROMIDE 10 MG/ML (PF) SYRINGE
PREFILLED_SYRINGE | INTRAVENOUS | Status: AC
  Filled 2019-01-18: qty 10

## 2019-01-18 MED ORDER — PROMETHAZINE HCL 25 MG/ML IJ SOLN: 6.2500 mg | INTRAMUSCULAR | Status: DC | PRN

## 2019-01-18 MED ORDER — LIDOCAINE 2% (20 MG/ML) 5 ML SYRINGE
INTRAMUSCULAR | Status: DC | PRN
  Administered 2019-01-18: 100 mg via INTRAVENOUS

## 2019-01-18 MED ORDER — LIDOCAINE 2% (20 MG/ML) 5 ML SYRINGE
INTRAMUSCULAR | Status: AC
  Filled 2019-01-18: qty 5

## 2019-01-18 MED ORDER — ONDANSETRON HCL 4 MG/2ML IJ SOLN
INTRAMUSCULAR | Status: DC | PRN
  Administered 2019-01-18: 4 mg via INTRAVENOUS

## 2019-01-18 MED ORDER — DEXAMETHASONE SODIUM PHOSPHATE 10 MG/ML IJ SOLN
INTRAMUSCULAR | Status: DC | PRN
  Administered 2019-01-18: 8 mg via INTRAVENOUS

## 2019-01-18 MED ORDER — DEXAMETHASONE SODIUM PHOSPHATE 10 MG/ML IJ SOLN
INTRAMUSCULAR | Status: AC
  Filled 2019-01-18: qty 1

## 2019-01-18 MED ORDER — SUCCINYLCHOLINE CHLORIDE 200 MG/10ML IV SOSY
PREFILLED_SYRINGE | INTRAVENOUS | Status: AC
  Filled 2019-01-18: qty 10

## 2019-01-18 SURGICAL SUPPLY — 39 items
APPLIER CLIP 5 13 M/L LIGAMAX5 (MISCELLANEOUS)
APPLIER CLIP ROT 10 11.4 M/L (STAPLE) ×2
CABLE HIGH FREQUENCY MONO STRZ (ELECTRODE) ×2 IMPLANT
CHLORAPREP W/TINT 26 (MISCELLANEOUS) ×2 IMPLANT
CHOLANGIOGRAM CATH TAUT (CATHETERS) ×2 IMPLANT
CLIP APPLIE 5 13 M/L LIGAMAX5 (MISCELLANEOUS) IMPLANT
CLIP APPLIE ROT 10 11.4 M/L (STAPLE) ×1 IMPLANT
COVER MAYO STAND STRL (DRAPES) ×2 IMPLANT
COVER SURGICAL LIGHT HANDLE (MISCELLANEOUS) ×2 IMPLANT
COVER WAND RF STERILE (DRAPES) ×2 IMPLANT
DECANTER SPIKE VIAL GLASS SM (MISCELLANEOUS) ×2 IMPLANT
DERMABOND ADVANCED (GAUZE/BANDAGES/DRESSINGS) ×1
DERMABOND ADVANCED .7 DNX12 (GAUZE/BANDAGES/DRESSINGS) ×1 IMPLANT
DEVICE TROCAR PUNCTURE CLOSURE (ENDOMECHANICALS) IMPLANT
DRAPE C-ARM 42X120 X-RAY (DRAPES) ×2 IMPLANT
ELECT REM PT RETURN 15FT ADLT (MISCELLANEOUS) ×2 IMPLANT
GLOVE SURG SIGNA 7.5 PF LTX (GLOVE) ×2 IMPLANT
GOWN STRL REUS W/TWL XL LVL3 (GOWN DISPOSABLE) ×6 IMPLANT
HEMOSTAT SURGICEL 4X8 (HEMOSTASIS) IMPLANT
IV CATH 14GX2 1/4 (CATHETERS) ×2 IMPLANT
IV SET EXTENSION CATH 6 NF (IV SETS) ×2 IMPLANT
KIT BASIN OR (CUSTOM PROCEDURE TRAY) ×2 IMPLANT
KIT TURNOVER KIT A (KITS) ×2 IMPLANT
POUCH RETRIEVAL ECOSAC 10 (ENDOMECHANICALS) ×1 IMPLANT
POUCH RETRIEVAL ECOSAC 10MM (ENDOMECHANICALS) ×1
SCISSORS LAP 5X35 DISP (ENDOMECHANICALS) ×2 IMPLANT
SET IRRIG TUBING LAPAROSCOPIC (IRRIGATION / IRRIGATOR) ×2 IMPLANT
SET TUBE SMOKE EVAC HIGH FLOW (TUBING) ×2 IMPLANT
SLEEVE ADV FIXATION 5X100MM (TROCAR) ×2 IMPLANT
STOPCOCK 4 WAY LG BORE MALE ST (IV SETS) ×2 IMPLANT
STRIP CLOSURE SKIN 1/4X4 (GAUZE/BANDAGES/DRESSINGS) IMPLANT
SUT MNCRL AB 4-0 PS2 18 (SUTURE) ×2 IMPLANT
SYR 10ML ECCENTRIC (SYRINGE) ×2 IMPLANT
TOWEL OR 17X26 10 PK STRL BLUE (TOWEL DISPOSABLE) ×2 IMPLANT
TOWEL OR NON WOVEN STRL DISP B (DISPOSABLE) ×2 IMPLANT
TRAY LAPAROSCOPIC (CUSTOM PROCEDURE TRAY) ×2 IMPLANT
TROCAR ADV FIXATION 11X100MM (TROCAR) ×2 IMPLANT
TROCAR ADV FIXATION 5X100MM (TROCAR) ×6 IMPLANT
TROCAR XCEL BLUNT TIP 100MML (ENDOMECHANICALS) ×2 IMPLANT
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
## 2019-01-18 NOTE — Anesthesia Preprocedure Evaluation (Signed)
Anesthesia Evaluation  Patient identified by MRN, date of birth, ID band Patient awake    Reviewed: Allergy & Precautions, NPO status , Patient's Chart, lab work & pertinent test results  Airway Mallampati: II  TM Distance: <3 FB Neck ROM: Full    Dental no notable dental hx.    Pulmonary sleep apnea ,    breath sounds clear to auscultation + decreased breath sounds      Cardiovascular negative cardio ROS Normal cardiovascular exam Rhythm:Regular Rate:Normal     Neuro/Psych negative neurological ROS  negative psych ROS   GI/Hepatic negative GI ROS, Neg liver ROS,   Endo/Other  Morbid obesity  Renal/GU negative Renal ROS  negative genitourinary   Musculoskeletal negative musculoskeletal ROS (+)   Abdominal   Peds negative pediatric ROS (+)  Hematology negative hematology ROS (+)   Anesthesia Other Findings   Reproductive/Obstetrics negative OB ROS                             Anesthesia Physical Anesthesia Plan  ASA: III  Anesthesia Plan: General   Post-op Pain Management:    Induction: Intravenous and Rapid sequence  PONV Risk Score and Plan: 2 and Ondansetron, Dexamethasone and Treatment may vary due to age or medical condition  Airway Management Planned: Oral ETT  Additional Equipment:   Intra-op Plan:   Post-operative Plan: Extubation in OR  Informed Consent: I have reviewed the patients History and Physical, chart, labs and discussed the procedure including the risks, benefits and alternatives for the proposed anesthesia with the patient or authorized representative who has indicated his/her understanding and acceptance.     Dental advisory given  Plan Discussed with: CRNA and Surgeon  Anesthesia Plan Comments:         Anesthesia Quick Evaluation

## 2019-01-18 NOTE — Transfer of Care (Signed)
Immediate Anesthesia Transfer of Care Note  Patient: Ross Spencer  Procedure(s) Performed: LAPAROSCOPIC CHOLECYSTECTOMY WITH INTRAOPERATIVE CHOLANGIOGRAM (N/A )  Patient Location: PACU  Anesthesia Type:General  Level of Consciousness: awake, alert  and oriented  Airway & Oxygen Therapy: Patient Spontanous Breathing and Patient connected to face mask oxygen  Post-op Assessment: Report given to RN and Post -op Vital signs reviewed and stable  Post vital signs: Reviewed and stable  Last Vitals:  Vitals Value Taken Time  BP    Temp    Pulse    Resp    SpO2      Last Pain:  Vitals:   01/18/19 0816  TempSrc:   PainSc: 0-No pain      Patients Stated Pain Goal: 3 (01/18/19 0816)  Complications: No apparent anesthesia complications

## 2019-01-18 NOTE — Anesthesia Procedure Notes (Signed)
Procedure Name: Intubation Date/Time: 01/18/2019 8:59 AM Performed by: Niel Hummer, CRNA Pre-anesthesia Checklist: Patient being monitored, Suction available, Emergency Drugs available and Patient identified Patient Re-evaluated:Patient Re-evaluated prior to induction Oxygen Delivery Method: Circle system utilized Preoxygenation: Pre-oxygenation with 100% oxygen Induction Type: IV induction Ventilation: Two handed mask ventilation required Laryngoscope Size: Mac and 4 Grade View: Grade II Tube size: 7.5 mm Number of attempts: 1 Airway Equipment and Method: Stylet Placement Confirmation: ETT inserted through vocal cords under direct vision,  positive ETCO2 and breath sounds checked- equal and bilateral Secured at: 23 cm Tube secured with: Tape Dental Injury: Teeth and Oropharynx as per pre-operative assessment
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
## 2019-01-19 ENCOUNTER — Encounter (HOSPITAL_COMMUNITY): Payer: Self-pay | Admitting: Surgery

## 2019-01-19 DIAGNOSIS — K839 Disease of biliary tract, unspecified: Secondary | ICD-10-CM

## 2019-01-19 DIAGNOSIS — R748 Abnormal levels of other serum enzymes: Secondary | ICD-10-CM

## 2019-01-19 LAB — CBC
HCT: 48.2 % (ref 39.0–52.0)
Hemoglobin: 14.7 g/dL (ref 13.0–17.0)
MCH: 27.9 pg (ref 26.0–34.0)
MCHC: 30.5 g/dL (ref 30.0–36.0)
MCV: 91.5 fL (ref 80.0–100.0)
Platelets: 248 10*3/uL (ref 150–400)
RBC: 5.27 MIL/uL (ref 4.22–5.81)
RDW: 13.2 % (ref 11.5–15.5)
WBC: 12.7 10*3/uL — ABNORMAL HIGH (ref 4.0–10.5)
nRBC: 0 % (ref 0.0–0.2)

## 2019-01-19 LAB — COMPREHENSIVE METABOLIC PANEL
ALT: 897 U/L — ABNORMAL HIGH (ref 0–44)
AST: 768 U/L — ABNORMAL HIGH (ref 15–41)
Albumin: 3.7 g/dL (ref 3.5–5.0)
Alkaline Phosphatase: 131 U/L — ABNORMAL HIGH (ref 38–126)
Anion gap: 7 (ref 5–15)
BUN: 9 mg/dL (ref 6–20)
CO2: 27 mmol/L (ref 22–32)
Calcium: 8.8 mg/dL — ABNORMAL LOW (ref 8.9–10.3)
Chloride: 105 mmol/L (ref 98–111)
Creatinine, Ser: 0.81 mg/dL (ref 0.61–1.24)
GFR calc Af Amer: >60 mL/min (ref >60)
GFR calc non Af Amer: >60 mL/min (ref >60)
Glucose, Bld: 122 mg/dL — ABNORMAL HIGH (ref 70–99)
Potassium: 4.1 mmol/L (ref 3.5–5.1)
Sodium: 139 mmol/L (ref 135–145)
Total Bilirubin: 3.6 mg/dL — ABNORMAL HIGH (ref 0.3–1.2)
Total Protein: 7.6 g/dL (ref 6.5–8.1)

## 2019-01-19 MED ORDER — ACETAMINOPHEN 500 MG PO TABS
1000.0000 mg | ORAL_TABLET | Freq: Three times a day (TID) | ORAL | Status: DC
  Administered 2019-01-19 – 2019-01-21 (×6): 1000 mg via ORAL
  Filled 2019-01-19 (×6): qty 2

## 2019-01-19 MED ORDER — IBUPROFEN 200 MG PO TABS: ORAL_TABLET | ORAL | Status: DC

## 2019-01-19 MED ORDER — ACETAMINOPHEN 500 MG PO TABS: ORAL_TABLET | ORAL | 0 refills | Status: DC

## 2019-01-19 MED ORDER — OXYCODONE HCL 5 MG PO TABS: 5.0000 mg | ORAL_TABLET | Freq: Four times a day (QID) | ORAL | 0 refills | Status: DC | PRN

## 2019-01-19 MED ORDER — OXYCODONE HCL 5 MG PO TABS: 5.0000 mg | ORAL_TABLET | ORAL | Status: DC | PRN

## 2019-01-19 MED ORDER — TRAMADOL HCL 50 MG PO TABS: 50.0000 mg | ORAL_TABLET | Freq: Four times a day (QID) | ORAL | Status: DC | PRN

## 2019-01-19 MED ORDER — SODIUM CHLORIDE 0.9 % IV SOLN
2.0000 g | INTRAVENOUS | Status: DC
  Administered 2019-01-19 – 2019-01-20 (×2): 2 g via INTRAVENOUS
  Filled 2019-01-19: qty 2
  Filled 2019-01-19: qty 20
  Filled 2019-01-19: qty 2

## 2019-01-19 MED ORDER — IBUPROFEN 400 MG PO TABS: 600.0000 mg | ORAL_TABLET | Freq: Four times a day (QID) | ORAL | Status: DC | PRN

## 2019-01-19 NOTE — Anesthesia Preprocedure Evaluation (Addendum)
Anesthesia Evaluation  Patient identified by MRN, date of birth, ID band Patient awake    Reviewed: Allergy & Precautions, NPO status , Patient's Chart, lab work & pertinent test results  Airway Mallampati: III  TM Distance: >3 FB Neck ROM: Full    Dental no notable dental hx. (+) Teeth Intact, Dental Advisory Given   Pulmonary sleep apnea ,    Pulmonary exam normal breath sounds clear to auscultation       Cardiovascular Exercise Tolerance: Good negative cardio ROS Normal cardiovascular exam Rhythm:Regular Rate:Normal     Neuro/Psych negative neurological ROS  negative psych ROS   GI/Hepatic negative GI ROS, Neg liver ROS,   Endo/Other  Morbid obesity  Renal/GU Cr 0.81 K+ 4.1     Musculoskeletal   Abdominal (+) + obese,   Peds  Hematology Hgb 14.7 Plt 248   Anesthesia Other Findings   Reproductive/Obstetrics                            Anesthesia Physical Anesthesia Plan  ASA: III  Anesthesia Plan: General   Post-op Pain Management:    Induction: Intravenous, Cricoid pressure planned and Rapid sequence  PONV Risk Score and Plan: 3 and Treatment may vary due to age or medical condition, Dexamethasone and Ondansetron  Airway Management Planned: Oral ETT  Additional Equipment:   Intra-op Plan:   Post-operative Plan: Extubation in OR  Informed Consent: I have reviewed the patients History and Physical, chart, labs and discussed the procedure including the risks, benefits and alternatives for the proposed anesthesia with the patient or authorized representative who has indicated his/her understanding and acceptance.     Dental advisory given  Plan Discussed with:   Anesthesia Plan Comments: (GA w ETT)       Anesthesia Quick Evaluation                                  Anesthesia Evaluation  Patient identified by MRN, date of birth, ID band Patient  awake    Reviewed: Allergy & Precautions, NPO status , Patient's Chart, lab work & pertinent test results  Airway Mallampati: II  TM Distance: <3 FB Neck ROM: Full    Dental no notable dental hx.    Pulmonary sleep apnea ,    breath sounds clear to auscultation + decreased breath sounds      Cardiovascular negative cardio ROS Normal cardiovascular exam Rhythm:Regular Rate:Normal     Neuro/Psych negative neurological ROS  negative psych ROS   GI/Hepatic negative GI ROS, Neg liver ROS,   Endo/Other  Morbid obesity  Renal/GU negative Renal ROS  negative genitourinary   Musculoskeletal negative musculoskeletal ROS (+)   Abdominal   Peds negative pediatric ROS (+)  Hematology negative hematology ROS (+)   Anesthesia Other Findings   Reproductive/Obstetrics negative OB ROS                             Anesthesia Physical Anesthesia Plan  ASA: III  Anesthesia Plan: General   Post-op Pain Management:    Induction: Intravenous and Rapid sequence  PONV Risk Score and Plan: 2 and Ondansetron, Dexamethasone and Treatment may vary due to age or medical condition  Airway Management Planned: Oral ETT  Additional Equipment:   Intra-op Plan:   Post-operative Plan: Extubation in OR  Informed Consent: I  have reviewed the patients History and Physical, chart, labs and discussed the procedure including the risks, benefits and alternatives for the proposed anesthesia with the patient or authorized representative who has indicated his/her understanding and acceptance.     Dental advisory given  Plan Discussed with: CRNA and Surgeon  Anesthesia Plan Comments:         Anesthesia Quick Evaluation
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
## 2019-01-19 NOTE — Anesthesia Postprocedure Evaluation (Signed)
Anesthesia Post Note  Patient: Ross Spencer  Procedure(s) Performed: LAPAROSCOPIC CHOLECYSTECTOMY WITH INTRAOPERATIVE CHOLANGIOGRAM (N/A )     Patient location during evaluation: PACU Anesthesia Type: General Level of consciousness: awake and alert Pain management: pain level controlled Vital Signs Assessment: post-procedure vital signs reviewed and stable Respiratory status: spontaneous breathing, nonlabored ventilation, respiratory function stable and patient connected to nasal cannula oxygen Cardiovascular status: blood pressure returned to baseline and stable Postop Assessment: no apparent nausea or vomiting Anesthetic complications: no    Last Vitals:  Vitals:   01/19/19 0629 01/19/19 0932  BP: 140/81 (!) 146/80  Pulse: 76 83  Resp: 19 18  Temp: 37 C 37 C  SpO2: 99% 100%    Last Pain:  Vitals:   01/19/19 0932  TempSrc: Oral  PainSc:                  Timathy Newberry,Raed S
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
## 2019-01-20 ENCOUNTER — Inpatient Hospital Stay (HOSPITAL_COMMUNITY): Payer: BLUE CROSS/BLUE SHIELD | Admitting: Anesthesiology

## 2019-01-20 ENCOUNTER — Inpatient Hospital Stay (HOSPITAL_COMMUNITY): Payer: BLUE CROSS/BLUE SHIELD

## 2019-01-20 ENCOUNTER — Encounter (HOSPITAL_COMMUNITY): Payer: Self-pay | Admitting: Gastroenterology

## 2019-01-20 ENCOUNTER — Encounter (HOSPITAL_COMMUNITY): Admission: EM | Disposition: A | Discharge status: Home / Self Care | Payer: Self-pay | Source: Home / Self Care

## 2019-01-20 DIAGNOSIS — R7989 Other specified abnormal findings of blood chemistry: Secondary | ICD-10-CM

## 2019-01-20 DIAGNOSIS — R945 Abnormal results of liver function studies: Secondary | ICD-10-CM

## 2019-01-20 DIAGNOSIS — R932 Abnormal findings on diagnostic imaging of liver and biliary tract: Secondary | ICD-10-CM

## 2019-01-20 DIAGNOSIS — K8050 Calculus of bile duct without cholangitis or cholecystitis without obstruction: Secondary | ICD-10-CM

## 2019-01-20 DIAGNOSIS — K2960 Other gastritis without bleeding: Secondary | ICD-10-CM

## 2019-01-20 DIAGNOSIS — K319 Disease of stomach and duodenum, unspecified: Secondary | ICD-10-CM | POA: Diagnosis not present

## 2019-01-20 HISTORY — PX: BIOPSY: SHX5522

## 2019-01-20 HISTORY — PX: SPHINCTEROTOMY: SHX5544

## 2019-01-20 HISTORY — PX: ERCP: SHX5425

## 2019-01-20 LAB — CBC
HCT: 47.5 % (ref 39.0–52.0)
Hemoglobin: 14.7 g/dL (ref 13.0–17.0)
MCH: 28.2 pg (ref 26.0–34.0)
MCHC: 30.9 g/dL (ref 30.0–36.0)
MCV: 91.2 fL (ref 80.0–100.0)
Platelets: 222 10*3/uL (ref 150–400)
RBC: 5.21 MIL/uL (ref 4.22–5.81)
RDW: 13.4 % (ref 11.5–15.5)
WBC: 7.9 10*3/uL (ref 4.0–10.5)
nRBC: 0 % (ref 0.0–0.2)

## 2019-01-20 LAB — COMPREHENSIVE METABOLIC PANEL
ALT: 573 U/L — ABNORMAL HIGH (ref 0–44)
AST: 204 U/L — ABNORMAL HIGH (ref 15–41)
Albumin: 3.4 g/dL — ABNORMAL LOW (ref 3.5–5.0)
Alkaline Phosphatase: 129 U/L — ABNORMAL HIGH (ref 38–126)
Anion gap: 8 (ref 5–15)
BUN: 7 mg/dL (ref 6–20)
CO2: 24 mmol/L (ref 22–32)
Calcium: 8.4 mg/dL — ABNORMAL LOW (ref 8.9–10.3)
Chloride: 105 mmol/L (ref 98–111)
Creatinine, Ser: 0.92 mg/dL (ref 0.61–1.24)
GFR calc Af Amer: >60 mL/min (ref >60)
GFR calc non Af Amer: >60 mL/min (ref >60)
Glucose, Bld: 108 mg/dL — ABNORMAL HIGH (ref 70–99)
Potassium: 3.5 mmol/L (ref 3.5–5.1)
Sodium: 137 mmol/L (ref 135–145)
Total Bilirubin: 2.3 mg/dL — ABNORMAL HIGH (ref 0.3–1.2)
Total Protein: 6.9 g/dL (ref 6.5–8.1)

## 2019-01-20 SURGERY — ERCP, WITH INTERVENTION IF INDICATED
Anesthesia: General

## 2019-01-20 MED ORDER — SUGAMMADEX SODIUM 200 MG/2ML IV SOLN
INTRAVENOUS | Status: DC | PRN
  Administered 2019-01-20: 300 mg via INTRAVENOUS

## 2019-01-20 MED ORDER — PROPOFOL 10 MG/ML IV BOLUS
INTRAVENOUS | Status: DC | PRN
  Administered 2019-01-20: 200 mg via INTRAVENOUS

## 2019-01-20 MED ORDER — ONDANSETRON HCL 4 MG/2ML IJ SOLN
INTRAMUSCULAR | Status: DC | PRN
  Administered 2019-01-20: 4 mg via INTRAVENOUS

## 2019-01-20 MED ORDER — MIDAZOLAM HCL 5 MG/5ML IJ SOLN
INTRAMUSCULAR | Status: DC | PRN
  Administered 2019-01-20: 2 mg via INTRAVENOUS

## 2019-01-20 MED ORDER — INDOMETHACIN 50 MG RE SUPP
RECTAL | Status: AC
  Filled 2019-01-20: qty 2

## 2019-01-20 MED ORDER — LACTATED RINGERS IV SOLN
INTRAVENOUS | Status: DC
  Administered 2019-01-20: 12:00:00 via INTRAVENOUS

## 2019-01-20 MED ORDER — INDOMETHACIN 50 MG RE SUPP: 100.0000 mg | Freq: Once | RECTAL | Status: DC

## 2019-01-20 MED ORDER — FENTANYL CITRATE (PF) 100 MCG/2ML IJ SOLN
INTRAMUSCULAR | Status: AC
  Filled 2019-01-20: qty 2

## 2019-01-20 MED ORDER — FENTANYL CITRATE (PF) 100 MCG/2ML IJ SOLN
INTRAMUSCULAR | Status: DC | PRN
  Administered 2019-01-20 (×2): 50 ug via INTRAVENOUS

## 2019-01-20 MED ORDER — GLUCAGON HCL RDNA (DIAGNOSTIC) 1 MG IJ SOLR
INTRAMUSCULAR | Status: AC
  Filled 2019-01-20: qty 1

## 2019-01-20 MED ORDER — INDOMETHACIN 50 MG RE SUPP
RECTAL | Status: DC | PRN
  Administered 2019-01-20: 100 mg via RECTAL

## 2019-01-20 MED ORDER — GLUCAGON HCL RDNA (DIAGNOSTIC) 1 MG IJ SOLR
INTRAMUSCULAR | Status: DC | PRN
  Administered 2019-01-20: 0.25 mg via INTRAVENOUS

## 2019-01-20 MED ORDER — ROCURONIUM BROMIDE 50 MG/5ML IV SOSY
PREFILLED_SYRINGE | INTRAVENOUS | Status: DC | PRN
  Administered 2019-01-20: 50 mg via INTRAVENOUS

## 2019-01-20 MED ORDER — PANTOPRAZOLE SODIUM 40 MG PO TBEC
40.0000 mg | DELAYED_RELEASE_TABLET | Freq: Every day | ORAL | Status: DC
  Administered 2019-01-20 – 2019-01-21 (×2): 40 mg via ORAL
  Filled 2019-01-20 (×2): qty 1

## 2019-01-20 MED ORDER — SUCCINYLCHOLINE CHLORIDE 200 MG/10ML IV SOSY
PREFILLED_SYRINGE | INTRAVENOUS | Status: DC | PRN
  Administered 2019-01-20: 100 mg via INTRAVENOUS

## 2019-01-20 MED ORDER — SODIUM CHLORIDE 0.9 % IV SOLN
INTRAVENOUS | Status: DC | PRN
  Administered 2019-01-20: 20 mL

## 2019-01-20 MED ORDER — MIDAZOLAM HCL 2 MG/2ML IJ SOLN
INTRAMUSCULAR | Status: AC
  Filled 2019-01-20: qty 2

## 2019-01-20 MED ORDER — LIDOCAINE 2% (20 MG/ML) 5 ML SYRINGE
INTRAMUSCULAR | Status: DC | PRN
  Administered 2019-01-20: 100 mg via INTRAVENOUS

## 2019-01-20 NOTE — Anesthesia Postprocedure Evaluation (Signed)
Anesthesia Post Note  Patient: Ross Spencer  Procedure(s) Performed: ENDOSCOPIC RETROGRADE CHOLANGIOPANCREATOGRAPHY (ERCP) (N/A ) BIOPSY SPHINCTEROTOMY     Patient location during evaluation: Endoscopy Anesthesia Type: General Level of consciousness: awake and alert Pain management: pain level controlled Vital Signs Assessment: post-procedure vital signs reviewed and stable Respiratory status: spontaneous breathing, nonlabored ventilation, respiratory function stable and patient connected to nasal cannula oxygen Cardiovascular status: blood pressure returned to baseline and stable Postop Assessment: no apparent nausea or vomiting Anesthetic complications: no    Last Vitals:  Vitals:   01/20/19 1403 01/20/19 1410  BP: (!) 186/104 (!) 184/98  Pulse: 64 64  Resp: 17 16  Temp:    SpO2: 100% 99%    Last Pain:  Vitals:   01/20/19 1343  TempSrc: Oral  PainSc: 0-No pain                 Trevor Iha
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
## 2019-01-20 NOTE — Anesthesia Procedure Notes (Addendum)
Procedure Name: Intubation Date/Time: 01/20/2019 12:44 PM Performed by: Maxwell Caul, CRNA Pre-anesthesia Checklist: Patient identified, Emergency Drugs available, Suction available and Patient being monitored Patient Re-evaluated:Patient Re-evaluated prior to induction Oxygen Delivery Method: Circle system utilized Preoxygenation: Pre-oxygenation with 100% oxygen Induction Type: IV induction, Rapid sequence and Cricoid Pressure applied Laryngoscope Size: Mac and 4 Grade View: Grade III Tube type: Oral Tube size: 7.5 mm Number of attempts: 2 Airway Equipment and Method: Stylet and Bougie stylet Placement Confirmation: ETT inserted through vocal cords under direct vision,  positive ETCO2 and breath sounds checked- equal and bilateral Secured at: 21 cm Tube secured with: Tape Dental Injury: Teeth and Oropharynx as per pre-operative assessment  Comments: DL x 1 with MAC 4, Grade 3 view, Bougie utilized and ETT 7.5 placed.

## 2019-01-20 NOTE — Interval H&P Note (Signed)
History and Physical Interval Note:  01/20/2019 12:02 PM  Ross Spencer  has presented today for surgery, with the diagnosis of abnormal liver tests, abnormal IOC.  The various methods of treatment have been discussed with the patient and family. After consideration of risks, benefits and other options for treatment, the patient has consented to  Procedure(s) with comments: ENDOSCOPIC RETROGRADE CHOLANGIOPANCREATOGRAPHY (ERCP) (N/A) - I spoke with Endo. Procedure scheduled from noon as a surgical intervention.  The patient's history has been reviewed, patient examined, no change in status, stable for surgery.  I have reviewed the patient's chart and labs.  Questions were answered to the patient's satisfaction.     Venita Lick. Russella Dar

## 2019-01-20 NOTE — Transfer of Care (Signed)
Immediate Anesthesia Transfer of Care Note  Patient: Ross Spencer  Procedure(s) Performed: ENDOSCOPIC RETROGRADE CHOLANGIOPANCREATOGRAPHY (ERCP) (N/A ) BIOPSY SPHINCTEROTOMY  Patient Location: PACU  Anesthesia Type:General  Level of Consciousness: awake, alert  and oriented  Airway & Oxygen Therapy: Patient Spontanous Breathing and Patient connected to face mask oxygen  Post-op Assessment: Report given to RN and Post -op Vital signs reviewed and stable  Post vital signs: Reviewed and stable  Last Vitals:  Vitals Value Taken Time  BP 194/101 01/20/2019  1:43 PM  Temp    Pulse 74 01/20/2019  1:46 PM  Resp 13 01/20/2019  1:46 PM  SpO2 100 % 01/20/2019  1:46 PM  Vitals shown include unvalidated device data.  Last Pain:  Vitals:   01/20/19 1343  TempSrc: Oral  PainSc: 0-No pain      Patients Stated Pain Goal: 2 (01/18/19 1515)  Complications: No apparent anesthesia complications

## 2019-01-20 NOTE — Op Note (Addendum)
[facility] Patient Name: Ross Spencer Procedure Date: 01/20/2019 MRN: [id number] Attending MD: Meryl Dare , MD Date of Birth: 09/13/1965 CSN: [id number] Age: 53 Admit Type: Inpatient Procedure:                ERCP Indications:              Bile duct stone(s), Abnormal IOC, Elevated liver                            enzymes, Elevated bilirubin Providers:                Venita Lick. Russella Dar, MD, Norman Clay, RN, Dwain Sarna,                            RN, Zoila Shutter, Technician, Maricela Curet, CRNA Referring MD:             Ovidio Kin, MD Medicines:                General Anesthesia Complications:            No immediate complications. Estimated Blood Loss:     Estimated blood loss: none. Procedure:                Pre-Anesthesia Assessment:                           - Prior to the procedure, a History and Physical                            was performed, and patient medications and                            allergies were reviewed. The patient's tolerance of                            previous anesthesia was also reviewed. The risks                            and benefits of the procedure and the sedation                            options and risks were discussed with the patient.                            All questions were answered, and informed consent                            was obtained. Prior Anticoagulants: The patient has                            taken no previous anticoagulant or antiplatelet                            agents. ASA Grade Assessment: II - A patient with  mild systemic disease. After reviewing the risks                            and benefits, the patient was deemed in                            satisfactory condition to undergo the procedure.                           After obtaining informed consent, the scope was                            passed under direct vision. Throughout the     procedure, the patient's blood pressure, pulse, and                            oxygen saturations were monitored continuously. The                            [id number]-Q180V ([id number] Olympus duodenoscope was                            introduced through the mouth, and used to inject                            contrast into and used to inject contrast into the                            bile duct. The ERCP was accomplished without                            difficulty. The patient tolerated the procedure                            well. Scope In: Scope Out: Findings:      A scout film of the abdomen was obtained. Surgical clips, consistent       with a previous cholecystectomy, were seen in the area of the right       upper quadrant of the abdomen. The esophagus was successfully intubated       under direct vision. The scope was advanced to a normal major papilla in       the descending duodenum without detailed examination of the pharynx,       larynx and associated structures, and upper GI tract. The upper GI tract       was grossly normal except for several antral erosions. Biopsies of the       antrum and body obtained. A straight Roadrunner wire was passed into the       biliary tree. The short-nosed traction sphincterotome was passed over       the guidewire and the bile duct was then deeply cannulated. Contrast was       injected. I personally interpreted the bile duct images. There was       appropriate flow of contrast through the ducts. The common bile duct       contained one stone, which  was 3 mm in diameter, and a small amount of       sludge/debris. A cholecystectomy had been performed. The biliary tree       otherwise appeared normal, without dilation. A 7 mm biliary       sphincterotomy was made with a traction (standard) sphincterotome using       ERBE electrocautery. There was no post-sphincterotomy bleeding. The       biliary tree was swept several times with a 9 mm balloon  starting at the       bifurcation. Sludge was swept from the duct. One stone was removed. No       stones or sludge remained. Excellent biliary drainage was then noted.       The PD was not cannulated or injected by intention. Impression:               - Prior cholecystectomy.                           - Choledocholithiasis and sludge was found.                            Complete removal was accomplished by biliary                            sphincterotomy and balloon extraction.                           - Erosive antral gastritis. Biopsied. Moderate Sedation:      Not Applicable - Patient had care per Anesthesia. Recommendation:           - Avoid aspirin and nonsteroidal anti-inflammatory                            except for Indocin given today.                           - Clear liquid diet today. Can advance after 1700                            to full liquids as tolerated.                           - Observe patient's clinical course following                            today's ERCP with therapeutic intervention.                           - Await path results.                           - Pantoprazole 40 mg qd for 2 months.                           - Repeat LFTs in 1 month.                           -  Outpatient GI follow up with Dr. Lavon Paganini as                            needed. Procedure Code(s):        --- Professional ---                           [phone], Endoscopic retrograde                            cholangiopancreatography (ERCP); with removal of                            calculi/debris from biliary/pancreatic duct(s)                           43262, Endoscopic retrograde                            cholangiopancreatography (ERCP); with                            sphincterotomy/papillotomy Diagnosis Code(s):        --- Professional ---                           Z90.49, Acquired absence of other specified parts                            of digestive tract                            K80.50, Calculus of bile duct without cholangitis                            or cholecystitis without obstruction                           R74.8, Abnormal levels of other serum enzymes                           E80.7, Disorder of bilirubin metabolism, unspecified CPT copyright 2019 [employer]. All rights reserved. The codes documented in this report are preliminary and upon coder review may  be revised to meet current compliance requirements. Meryl Dare, MD 01/20/2019 1:46:42 PM This report has been signed electronically. Number of Addenda: 0

## 2019-01-21 ENCOUNTER — Encounter: Payer: Self-pay | Admitting: Gastroenterology

## 2019-01-21 DIAGNOSIS — K8050 Calculus of bile duct without cholangitis or cholecystitis without obstruction: Secondary | ICD-10-CM

## 2019-01-21 LAB — COMPREHENSIVE METABOLIC PANEL
ALT: 377 U/L — ABNORMAL HIGH (ref 0–44)
AST: 85 U/L — ABNORMAL HIGH (ref 15–41)
Albumin: 3.3 g/dL — ABNORMAL LOW (ref 3.5–5.0)
Alkaline Phosphatase: 116 U/L (ref 38–126)
Anion gap: 9 (ref 5–15)
BUN: 9 mg/dL (ref 6–20)
CO2: 26 mmol/L (ref 22–32)
Calcium: 8.6 mg/dL — ABNORMAL LOW (ref 8.9–10.3)
Chloride: 102 mmol/L (ref 98–111)
Creatinine, Ser: 1 mg/dL (ref 0.61–1.24)
GFR calc Af Amer: >60 mL/min (ref >60)
GFR calc non Af Amer: >60 mL/min (ref >60)
Glucose, Bld: 92 mg/dL (ref 70–99)
Potassium: 4.1 mmol/L (ref 3.5–5.1)
Sodium: 137 mmol/L (ref 135–145)
Total Bilirubin: 1.7 mg/dL — ABNORMAL HIGH (ref 0.3–1.2)
Total Protein: 6.9 g/dL (ref 6.5–8.1)

## 2019-01-21 LAB — CBC
HCT: 48.3 % (ref 39.0–52.0)
Hemoglobin: 15 g/dL (ref 13.0–17.0)
MCH: 28.6 pg (ref 26.0–34.0)
MCHC: 31.1 g/dL (ref 30.0–36.0)
MCV: 92.2 fL (ref 80.0–100.0)
Platelets: 226 10*3/uL (ref 150–400)
RBC: 5.24 MIL/uL (ref 4.22–5.81)
RDW: 13.4 % (ref 11.5–15.5)
WBC: 9.8 10*3/uL (ref 4.0–10.5)
nRBC: 0 % (ref 0.0–0.2)

## 2019-01-21 MED ORDER — PANTOPRAZOLE SODIUM 40 MG PO TBEC: 40.0000 mg | DELAYED_RELEASE_TABLET | Freq: Every day | ORAL | 1 refills | Status: DC
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
## 2019-01-21 NOTE — Progress Notes (Signed)
Progress Note    ASSESSMENT AND PLAN:   1.  Symptomatic cholelithiasis / chronic cholecystitis, status post laparoscopic cholecystectomy this admission.  2.  Choledocholithiasis, status post ERCP sphincterotomy and removal of sludge and stone from bile duct.  -Doing well, liver tests continue downward trend  -Follow up liver tests at our office in one month. Our office will call to arrange  3. Antral erosions, path pending. Our office will contact him with results.  -PPI daily x 2 months.   SUBJECTIVE   No complaints, for discharge home today   o complaints, for discharged toda  OBJECTIVE:     Vital signs in last 24 hours: Temp:  [97.8 F (36.6 C)-99.1 F (37.3 C)] 99.1 F (37.3 C) (05/21 0607) Pulse Rate:  [64-74] 74 (05/21 0607) Resp:  [13-21] 18 (05/21 0607) BP: (119-194)/(77-104) 129/81 (05/21 0607) SpO2:  [96 %-100 %] 96 % (05/21 0607) Last BM Date: (01/20/19) General:   Alert, in bedside chair Heart:  Regular rate and rhythm; no murmur.  No lower extremity edema   Pulm: Normal respiratory effort Abdomen:  Soft, nondistended, nontender.   Neurologic:  Alert and  oriented x4;  grossly normal neurologically. Psych:  Pleasant, cooperative.  Normal mood and affect.   Intake/Output from previous day: 05/20 0701 - 05/21 0700 In: 1540.6 [P.O.:840; I.V.:700; IV Piggyback:0.6] Out: 2300 [Urine:2300] Intake/Output this shift: Total I/O In: 360 [P.O.:360] Out: -   Lab Results: Recent Labs    01/19/19 0755 01/20/19 0728 01/21/19 0339  WBC 12.7* 7.9 9.8  HGB 14.7 14.7 15.0  HCT 48.2 47.5 48.3  PLT 248 222 226   BMET Recent Labs    01/19/19 0755 01/20/19 0728 01/21/19 0339  NA 139 137 137  K 4.1 3.5 4.1  CL 105 105 102  CO2 27 24 26   GLUCOSE 122* 108* 92  BUN 9 7 9   CREATININE 0.81 0.92 1.00  CALCIUM 8.8* 8.4* 8.6*   LFT Recent Labs    01/21/19 0339  PROT 6.9  ALBUMIN 3.3*  AST 85*  ALT 377*  ALKPHOS 116  BILITOT 1.7*   PT/INR No  results for input(s): LABPROT, INR in the last 72 hours. Hepatitis Panel No results for input(s): HEPBSAG, HCVAB, HEPAIGM, HEPBIGM in the last 72 hours.  Dg Ercp With Sphincterotomy  Result Date: 01/20/2019 CLINICAL DATA:  Choledocholithiasis EXAM: ERCP TECHNIQUE: Multiple spot images obtained with the fluoroscopic device and submitted for interpretation post-procedure. FLUOROSCOPY TIME:  2 minutes, 25 seconds COMPARISON:  Intraoperative cholangiogram during laparoscopic cholecystectomy-01/18/2019 FINDINGS: Ten spot intraoperative fluoroscopic images of the right upper abdominal quadrant during ERCP are provided for review. Initial image demonstrates an ERCP probe overlying the right upper abdominal quadrant. Post cholecystectomy with an apparent surgical drain overlying expected location the gallbladder fossa. There is selective cannulation and opacification of the common bile duct which appears nondilated. There is a transient nonocclusive filling defect in the distal aspect of the CBD which may represent an air bubble versus nonocclusive choledocholithiasis. Subsequent images demonstrate insufflation of a balloon within the central aspect of the CBD with subsequent biliary sweeping and presumed sphincterotomy. There is no definitive opacification of the residual cystic duct. IMPRESSION: ERCP with biliary sweeping and presumed sphincterotomy as above. These images were submitted for radiologic interpretation only. Please see the procedural report for the amount of contrast and the fluoroscopy time utilized. Electronically Signed   By: Simonne ComeJohn  Watts M.D.   On: 01/20/2019 13:39  Active Problems:   Acute cholecystitis   Abnormal cholangiogram   Choledocholithiasis   Elevated LFTs   Erosive gastritis     LOS: 4 days   Willette Cluster ,NP 01/21/2019, 10:06 AM
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
## 2019-01-21 NOTE — Discharge Instructions (Signed)
[facility] ______[facility] °LAPAROSCOPIC SURGERY: POST OP INSTRUCTIONS °Always review your discharge instruction sheet given to you by the facility where your surgery was performed. °IF YOU HAVE DISABILITY OR FAMILY LEAVE FORMS, YOU MUST BRING THEM TO THE OFFICE FOR PROCESSING.   °DO NOT GIVE THEM TO YOUR DOCTOR. ° °1. A prescription for pain medication may be given to you upon discharge.  Take your pain medication as prescribed, if needed.  If narcotic pain medicine is not needed, then you may take acetaminophen (Tylenol) or ibuprofen (Advil) as needed. °2. Take your usually prescribed medications unless otherwise directed. °3. If you need a refill on your pain medication, please contact your pharmacy.  They will contact our office to request authorization. Prescriptions will not be filled after 5pm or on week-ends. °4. You should follow a light diet the first few days after arrival home, such as soup and crackers, etc.  Be sure to include lots of fluids daily. °5. Most patients will experience some swelling and bruising in the area of the incisions.  Ice packs will help.  Swelling and bruising can take several days to resolve.  °6. It is common to experience some constipation if taking pain medication after surgery.  Increasing fluid intake and taking a stool softener (such as Colace) will usually help or prevent this problem from occurring.  A mild laxative (Milk of Magnesia or Miralax) should be taken according to package instructions if there are no bowel movements after 48 hours. °7. Unless discharge instructions indicate otherwise, you may remove your bandages 24-48 hours after surgery, and you may shower at that time.  You may have steri-strips (small skin tapes) in place directly over the incision.  These strips should be left on the skin for 7-10 days.  If your surgeon used skin glue on the incision, you may shower in 24 hours.  The glue will flake off over the next 2-3 weeks.  Any sutures or  staples will be removed at the office during your follow-up visit. °8. ACTIVITIES:  You may resume regular (light) daily activities beginning the next day--such as daily self-care, walking, climbing stairs--gradually increasing activities as tolerated.  You may have sexual intercourse when it is comfortable.  Refrain from any heavy lifting or straining until approved by your doctor. °a. You may drive when you are no longer taking prescription pain medication, you can comfortably wear a seatbelt, and you can safely maneuver your car and apply brakes. °b. RETURN TO WORK:  __________________________________________________________ °9. You should see your doctor in the office for a follow-up appointment approximately 2-3 weeks after your surgery.  Make sure that you call for this appointment within a day or two after you arrive home to insure a convenient appointment time. °10. OTHER INSTRUCTIONS: __________________________________________________________________________________________________________________________ __________________________________________________________________________________________________________________________ °WHEN TO CALL YOUR DOCTOR: °1. Fever over 101.0 °2. Inability to urinate °3. Continued bleeding from incision. °4. Increased pain, redness, or drainage from the incision. °5. Increasing abdominal pain ° °The clinic staff is available to answer your questions during regular business hours.  Please don’t hesitate to call and ask to speak to one of the nurses for clinical concerns.  If you have a medical emergency, go to the nearest emergency room or call 911.  A surgeon from [facility] is always on call at the hospital. °[address], [address], [place], [place]  [address] ? [address] [address], [place],    [address] °[phone] ? [phone] ? FAX [phone] °Web site:   [email or web address]   Endoscopic Retrograde Cholangiopancreatogram, Care  After Call Dr. Russella Dar for any issues. This sheet gives you information about how to care for yourself after your procedure. Your health care provider may also give you more specific instructions. If you have problems or questions, contact your health care provider. What can I expect after the procedure? After the procedure, it is common to have:  Soreness in your throat.  Nausea.  Bloating.  Dizziness.  Tiredness (fatigue). Follow these instructions at home:   Take over-the-counter and prescription medicines only as told by your health care provider.  Do not drive for 24 hours if you were given a medicine to help you relax (sedative) during your procedure. Have someone stay with you for 24 hours after the procedure.  Return to your normal activities as told by your health care provider. Ask your health care provider what activities are safe for you.  Return to eating what you normally do as soon as you feel well enough or as told by your health care provider.  Keep all follow-up visits as told by your health care provider. This is important. Contact a health care provider if:  You have pain in your abdomen that does not get better with medicine.  You develop signs of infection, such as: ? Chills. ? Feeling unwell. Get help right away if:  You have difficulty swallowing.  You have worsening pain in your throat, chest, or abdomen.  You vomit bright red blood or a substance that looks like coffee grounds.  You have bloody or very black stools.  You have a fever.  You have a sudden increase in swelling (bloating) in your abdomen. Summary  After the procedure, it is common to feel tired and to have some discomfort in your throat.  Contact your health care provider if you have signs of infection--such as chills or feeling unwell--or if you have pain that does not improve with medicine.  Get help right away if you have trouble swallowing, worsening pain, bloody or black  vomit, bloody or black stools, a fever, or increased swelling in your abdomen.  Keep all follow-up visits as told by your health care provider. This is important. This information is not intended to replace advice given to you by your health care provider. Make sure you discuss any questions you have with your health care provider. Document Released: 06/09/2013 Document Revised: 07/08/2016 Document Reviewed: 07/08/2016 Elsevier Interactive Patient Education  2019 [employer].

## 2019-01-22 ENCOUNTER — Encounter (HOSPITAL_COMMUNITY): Payer: Self-pay | Admitting: Gastroenterology
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
## 2019-02-01 ENCOUNTER — Other Ambulatory Visit: Payer: Self-pay

## 2019-02-01 DIAGNOSIS — R7989 Other specified abnormal findings of blood chemistry: Secondary | ICD-10-CM

## 2019-02-22 ENCOUNTER — Other Ambulatory Visit: Payer: Self-pay

## 2019-02-22 ENCOUNTER — Ambulatory Visit (INDEPENDENT_AMBULATORY_CARE_PROVIDER_SITE_OTHER): Payer: BC Managed Care – PPO | Admitting: Family Medicine

## 2019-02-22 DIAGNOSIS — E782 Mixed hyperlipidemia: Secondary | ICD-10-CM | POA: Diagnosis not present

## 2019-02-22 DIAGNOSIS — R739 Hyperglycemia, unspecified: Secondary | ICD-10-CM

## 2019-02-22 DIAGNOSIS — E559 Vitamin D deficiency, unspecified: Secondary | ICD-10-CM | POA: Diagnosis not present

## 2019-02-22 DIAGNOSIS — K810 Acute cholecystitis: Secondary | ICD-10-CM
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
## 2019-02-22 NOTE — Assessment & Plan Note (Signed)
Asymptomatic, recheck cmp and vitamin D level

## 2019-02-22 NOTE — Assessment & Plan Note (Signed)
Has recovered well from surgery and reports no concerning symptoms. Has returned to normal BM and incision sites healing well

## 2019-02-22 NOTE — Assessment & Plan Note (Signed)
Encouraged heart healthy diet, increase exercise, avoid trans fats, consider a krill oil cap daily 

## 2019-02-22 NOTE — Addendum Note (Signed)
Addended by: Magdalene Molly A on: 02/22/2019 02:09 PM   Modules accepted: Orders

## 2019-02-22 NOTE — Assessment & Plan Note (Signed)
hgba1c acceptable, minimize simple carbs. Increase exercise as tolerated.  

## 2019-02-22 NOTE — Assessment & Plan Note (Signed)
Encouraged DASH diet, decrease po intake and increase exercise as tolerated. Needs 7-8 hours of sleep nightly. Avoid trans fats, eat small, frequent meals every 4-5 hours with lean proteins, complex carbs and healthy fats. Minimize simple carbs, GMO foods. 

## 2019-03-10 ENCOUNTER — Other Ambulatory Visit: Payer: Self-pay

## 2019-03-10 ENCOUNTER — Other Ambulatory Visit (INDEPENDENT_AMBULATORY_CARE_PROVIDER_SITE_OTHER): Payer: Self-pay

## 2019-03-10 DIAGNOSIS — E559 Vitamin D deficiency, unspecified: Secondary | ICD-10-CM

## 2019-03-10 DIAGNOSIS — R739 Hyperglycemia, unspecified: Secondary | ICD-10-CM

## 2019-03-10 DIAGNOSIS — E782 Mixed hyperlipidemia: Secondary | ICD-10-CM

## 2019-03-10 LAB — COMPREHENSIVE METABOLIC PANEL
ALT: 19 U/L (ref 0–53)
AST: 14 U/L (ref 0–37)
Albumin: 4 g/dL (ref 3.5–5.2)
Alkaline Phosphatase: 71 U/L (ref 39–117)
BUN: 13 mg/dL (ref 6–23)
CO2: 29 mEq/L (ref 19–32)
Calcium: 9.2 mg/dL (ref 8.4–10.5)
Chloride: 102 mEq/L (ref 96–112)
Creatinine, Ser: 0.93 mg/dL (ref 0.40–1.50)
GFR: 103.16 mL/min (ref >60.00)
Glucose, Bld: 105 mg/dL — ABNORMAL HIGH (ref 70–99)
Potassium: 4.6 mEq/L (ref 3.5–5.1)
Sodium: 139 mEq/L (ref 135–145)
Total Bilirubin: 0.5 mg/dL (ref 0.2–1.2)
Total Protein: 7.2 g/dL (ref 6.0–8.3)

## 2019-03-10 LAB — CBC
HCT: 46.4 % (ref 39.0–52.0)
Hemoglobin: 14.9 g/dL (ref 13.0–17.0)
MCHC: 32.2 g/dL (ref 30.0–36.0)
MCV: 88.7 fl (ref 78.0–100.0)
Platelets: 265 10*3/uL (ref 150.0–400.0)
RBC: 5.23 Mil/uL (ref 4.22–5.81)
RDW: 14 % (ref 11.5–15.5)
WBC: 7.2 10*3/uL (ref 4.0–10.5)

## 2019-03-10 LAB — HEMOGLOBIN A1C: Hgb A1c MFr Bld: 6 % (ref 4.6–6.5)

## 2019-03-10 LAB — LIPID PANEL
Cholesterol: 175 mg/dL (ref 0–200)
HDL: 45.1 mg/dL (ref >39.00)
LDL Cholesterol: 99 mg/dL (ref 0–99)
NonHDL: 130.33
Total CHOL/HDL Ratio: 4
Triglycerides: 155 mg/dL — ABNORMAL HIGH (ref 0.0–149.0)
VLDL: 31 mg/dL (ref 0.0–40.0)

## 2019-03-10 LAB — VITAMIN D 25 HYDROXY (VIT D DEFICIENCY, FRACTURES): VITD: 9.65 ng/mL — ABNORMAL LOW (ref 30.00–100.00)

## 2019-03-10 LAB — TSH: TSH: 2.56 u[IU]/mL (ref 0.35–4.50)

## 2019-03-15 MED ORDER — VITAMIN D (ERGOCALCIFEROL) 1.25 MG (50000 UNIT) PO CAPS: 50000.0000 [IU] | ORAL_CAPSULE | ORAL | 4 refills | Status: DC

## 2019-03-15 NOTE — Addendum Note (Signed)
Addended by: Magdalene Molly A on: 03/15/2019 03:09 PM   Modules accepted: Orders

## 2019-09-11 IMAGING — US ULTRASOUND ABDOMEN LIMITED
1 series · 13 of 25 positions shown · non-contrast
Comparison: None.

CLINICAL DATA: Right upper quadrant pain.

EXAM:
ULTRASOUND ABDOMEN LIMITED RIGHT UPPER QUADRANT

[Series 1: ultrasound abdomen limited · 13 of 52 slices shown]
[im 1/52]
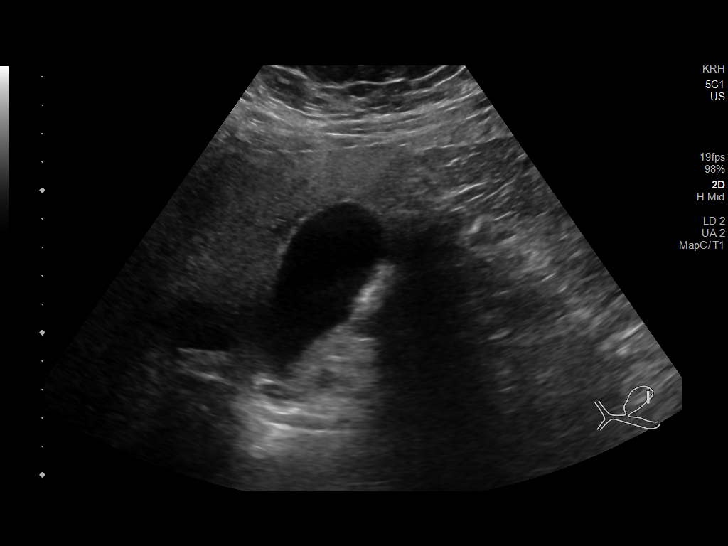
[im 5/52]
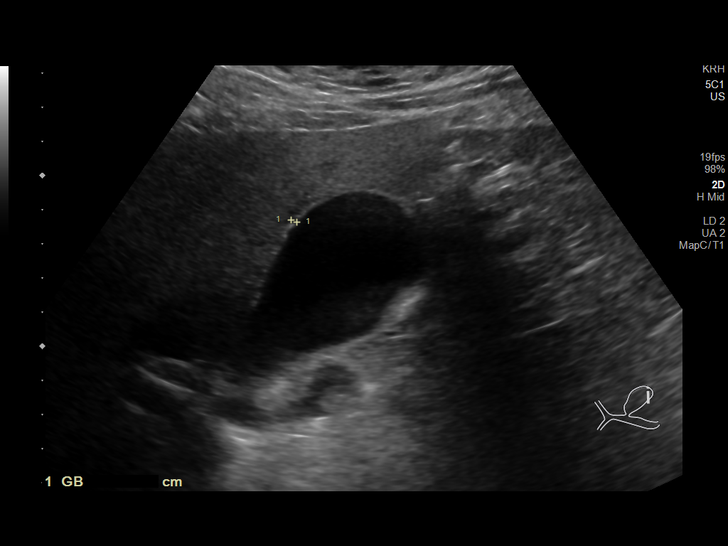
[im 9/52]
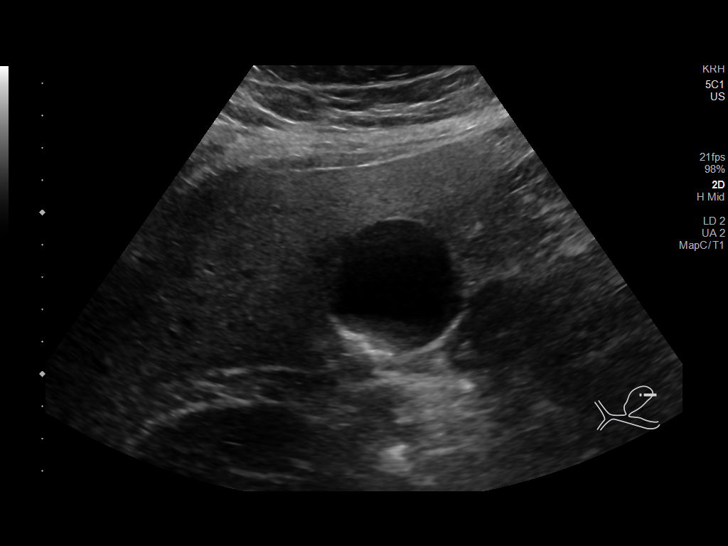
[im 13/52]
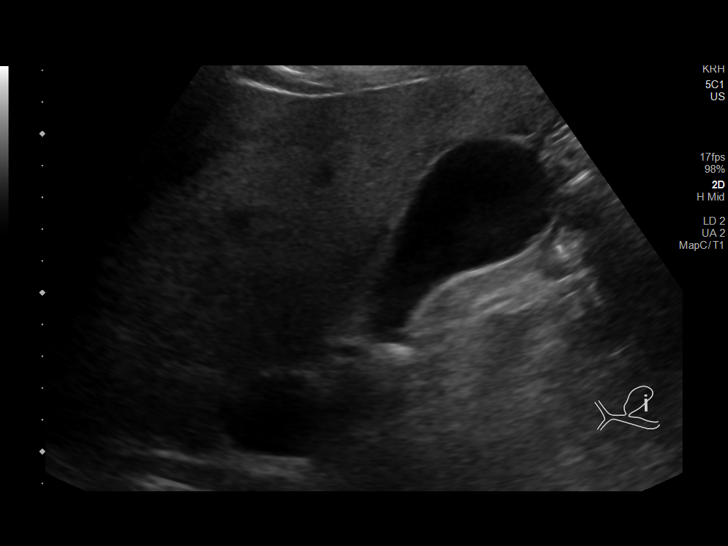
[im 18/52]
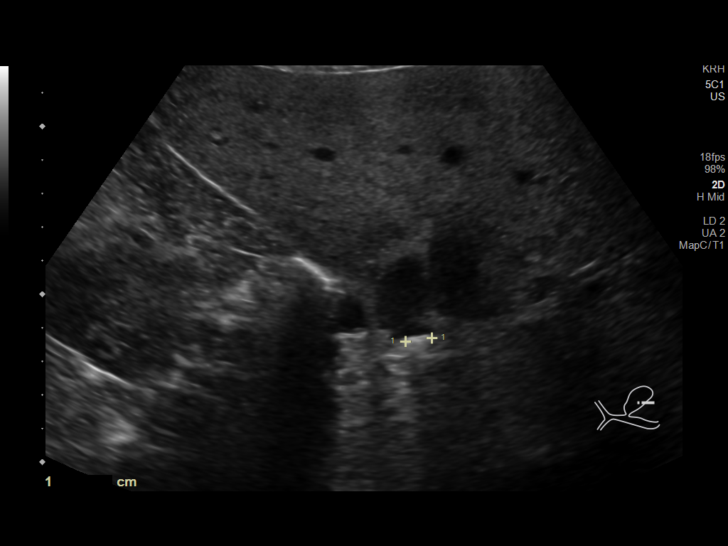
[im 22/52]
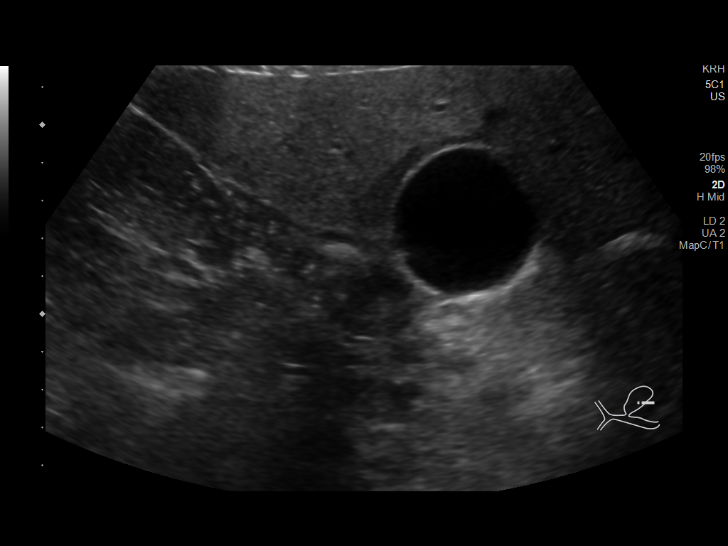
[im 26/52]
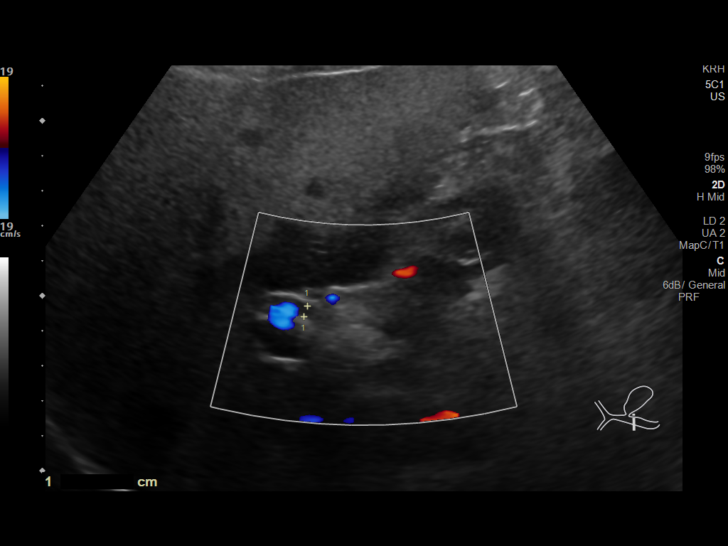
[im 30/52]
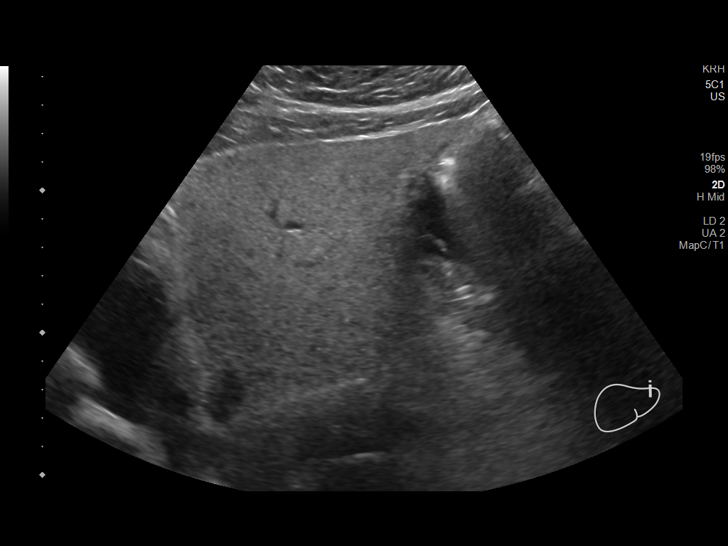
[im 35/52]
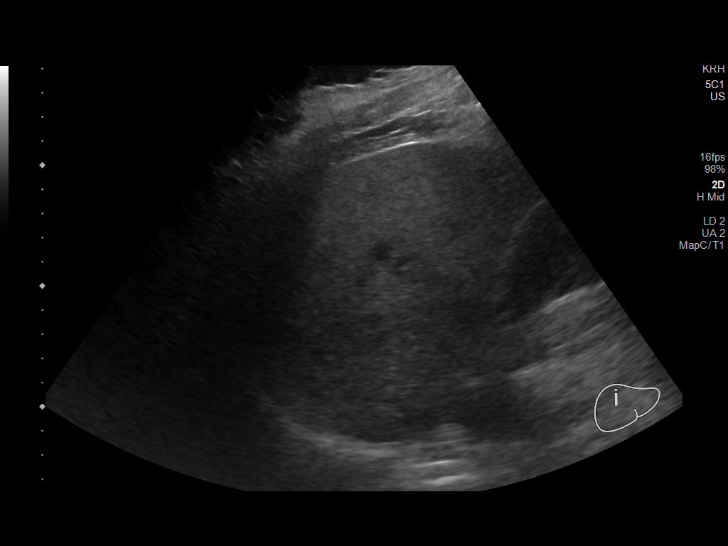
[im 39/52]
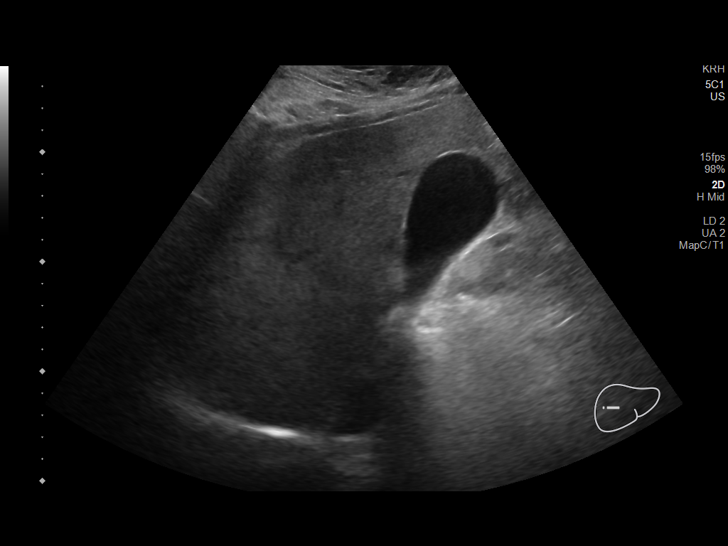
[im 43/52]
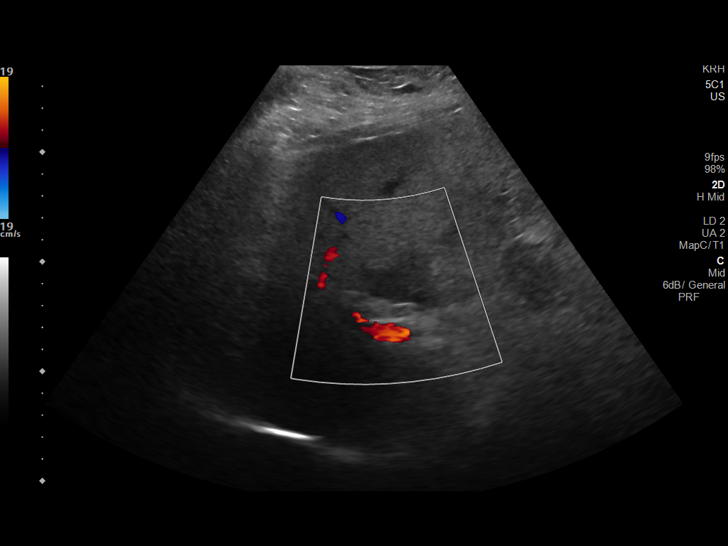
[im 47/52]
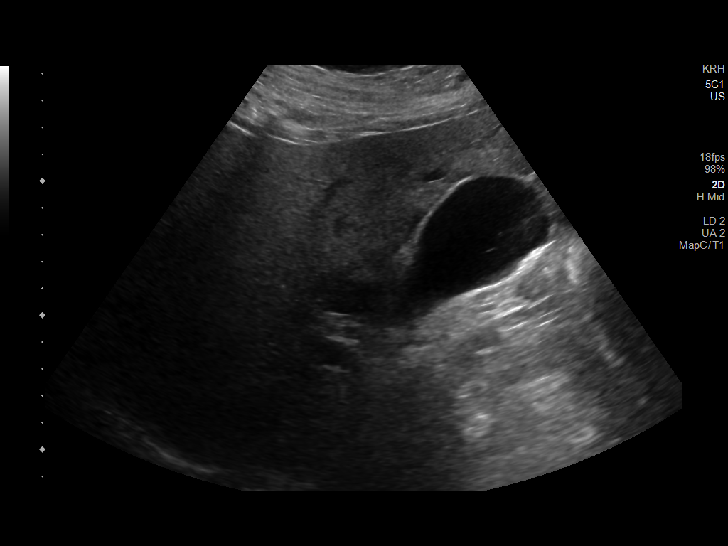
[im 52/52]
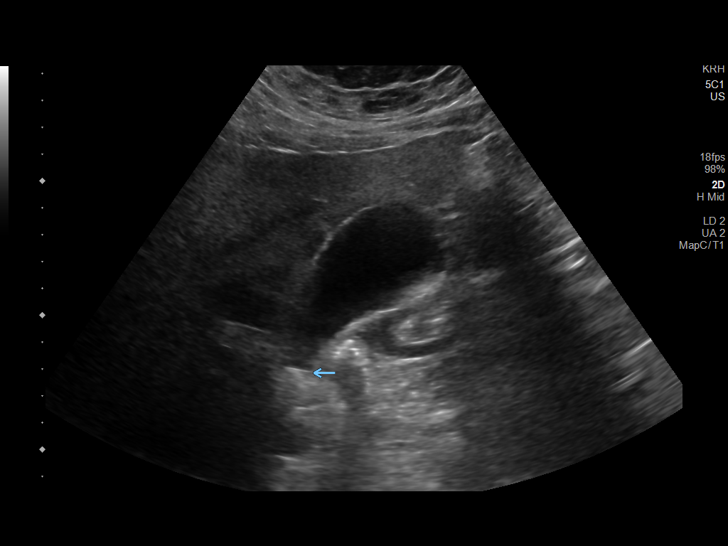

[13 of 25 positions shown; findings below may reference images not displayed]

FINDINGS: Gallbladder:

There is an 8 mm stone in the neck of the gallbladder. There is also
sludge in the gallbladder. No wall thickening or pericholecystic
fluid. A positive Murphy's sign is reported.

Common bile duct:

Diameter: 3 mm

Liver:

Increased echogenicity diffusely. There is a 3.1 x 2.2 x 3.3 cm
hypoechoic region adjacent to the porta hepatis. No other evidence
of mass. Portal vein is patent on color Doppler imaging with normal
direction of blood flow towards the liver.
IMPRESSION: 1. There is a stone in the neck of the gallbladder in addition to
sludge. There is also a positive Murphy's sign. However, no wall
thickening or pericholecystic fluid is identified. Acute
cholecystitis is not excluded. If the clinical picture remains
ambiguous, recommend a HIDA scan for further evaluation.
2. The 3.1 x 2.2 x 3.3 cm region of hypoechogenicity in the liver
adjacent to the porta hepatis is favored to represent focal fatty
sparing. However, a mass cannot be excluded on this study. An MRI as
an outpatient could better evaluate this region.

## 2019-11-22 ENCOUNTER — Encounter: Payer: Self-pay | Admitting: Family Medicine

## 2019-11-23 ENCOUNTER — Other Ambulatory Visit: Payer: Self-pay

## 2019-11-24 ENCOUNTER — Ambulatory Visit (INDEPENDENT_AMBULATORY_CARE_PROVIDER_SITE_OTHER): Payer: Self-pay | Admitting: Internal Medicine

## 2019-11-24 ENCOUNTER — Other Ambulatory Visit: Payer: Self-pay

## 2019-11-24 ENCOUNTER — Encounter: Payer: Self-pay | Admitting: Internal Medicine

## 2019-11-24 VITALS — BP 149/76 | HR 94 | Temp 96.1°F | Resp 18 | Ht 70.0 in | Wt 312.4 lb

## 2019-11-24 DIAGNOSIS — M79604 Pain in right leg: Secondary | ICD-10-CM

## 2019-11-24 DIAGNOSIS — R600 Localized edema: Secondary | ICD-10-CM

## 2019-11-24 DIAGNOSIS — Z86718 Personal history of other venous thrombosis and embolism: Secondary | ICD-10-CM

## 2019-11-24 NOTE — Progress Notes (Signed)
Pre visit review using our clinic review tool, if applicable. No additional management support is needed unless otherwise documented below in the visit note. 
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
## 2019-11-24 NOTE — Patient Instructions (Addendum)
   STOP BY THE FIRST FLOOR:  get the ultrasound of your right leg.  Even if the ultrasound is negative, if the leg continue hurting or get swollen please call the office  Please watch your salt intake, try to lose some weight.

## 2019-11-25 ENCOUNTER — Telehealth: Payer: Self-pay | Admitting: Internal Medicine

## 2019-11-25 ENCOUNTER — Ambulatory Visit (HOSPITAL_BASED_OUTPATIENT_CLINIC_OR_DEPARTMENT_OTHER)
Admission: RE | Admit: 2019-11-25 | Discharge: 2019-11-25 | Disposition: A | Discharge status: Home / Self Care | Payer: Self-pay | Source: Ambulatory Visit | Attending: Internal Medicine | Admitting: Internal Medicine

## 2019-11-25 DIAGNOSIS — M79604 Pain in right leg: Secondary | ICD-10-CM | POA: Insufficient documentation

## 2019-11-25 DIAGNOSIS — Z86718 Personal history of other venous thrombosis and embolism: Secondary | ICD-10-CM | POA: Insufficient documentation

## 2019-11-25 MED ORDER — APIXABAN 5 MG PO TABS: 5.0000 mg | ORAL_TABLET | Freq: Two times a day (BID) | ORAL | 0 refills | Status: DC

## 2019-11-25 NOTE — Telephone Encounter (Signed)
Ultrasound + DVT. I talked with the patient, plan: Eliquis 5 mg: 2 tablets twice a day for 1 week (#28 samples provided) Then Eliquis 5 mg twice daily.  Prescription sent, #60, no refills. He knows this is an anticoagulant he needs to be careful with bleeding. Needs to see PCP in 1 month.

## 2019-11-25 NOTE — Telephone Encounter (Signed)
Handed patient 5 mg samples of eliquis per Dr. Drue Novel. 2 boxes dispensed to patient. Lot #[id number] EXP 09/2021. 5 mg two tablets bid, 20 mg total. x1 week. Sample log in book has been filled out.

## 2019-11-26 ENCOUNTER — Telehealth: Payer: Self-pay | Admitting: Family Medicine

## 2019-11-26 NOTE — Telephone Encounter (Signed)
Please advise 

## 2019-11-26 NOTE — Telephone Encounter (Addendum)
The patient has no insurance.  He was provided 1 week samples of Eliquis 5 mg: 2 tablets daily.  Please call the drug rep for Eliquis or Xarelto and ask for a 43-month supply.  PCP or me could sign for that.  Eventually, he might need to go on Coumadin

## 2019-11-26 NOTE — Telephone Encounter (Signed)
Sheketia or Selena Batten- can you assist with this? Unsure who the reps are.

## 2019-11-26 NOTE — Telephone Encounter (Signed)
Caller: Frankie Scipio CallBack # [phone]  Concern: Patient can not afford medication prescribed, patient would like another medication, possibly a generic medication.   Please advise

## 2019-11-29 NOTE — Telephone Encounter (Signed)
Spoke with Eliquis representative Reece Levy, [phone]). They are unable to provide patient with samples.  Copy of "30 Day free trial" card printed and left up front for patient to pick up.  Spoke with patient, advised of coupon to pick up and provided website for patient assistance via MyChart ([email or web address]).  Advised patient to contact office with any issues/problems with obtaining medication. Patient verbalized understanding.

## 2019-12-22 ENCOUNTER — Ambulatory Visit: Payer: Self-pay | Admitting: Internal Medicine

## 2020-01-18 ENCOUNTER — Ambulatory Visit (INDEPENDENT_AMBULATORY_CARE_PROVIDER_SITE_OTHER): Payer: Self-pay | Admitting: Family Medicine

## 2020-01-18 ENCOUNTER — Other Ambulatory Visit: Payer: Self-pay

## 2020-01-18 ENCOUNTER — Encounter: Payer: Self-pay | Admitting: Family Medicine

## 2020-01-18 VITALS — BP 128/78 | HR 88 | Temp 98.3°F | Resp 12 | Ht 70.0 in | Wt 314.0 lb

## 2020-01-18 DIAGNOSIS — R739 Hyperglycemia, unspecified: Secondary | ICD-10-CM

## 2020-01-18 DIAGNOSIS — E559 Vitamin D deficiency, unspecified: Secondary | ICD-10-CM

## 2020-01-18 DIAGNOSIS — G8929 Other chronic pain: Secondary | ICD-10-CM

## 2020-01-18 DIAGNOSIS — M79672 Pain in left foot: Secondary | ICD-10-CM

## 2020-01-18 NOTE — Patient Instructions (Addendum)
[place] Blood Pressure cuff, upper arm, want BP 100-140/60-90 Pulse oximeter, want oxygen in 90s  Weekly vitals  Take Multivitamin with minerals, selenium Vitamin D 3000-2000 IU daily Probiotic with lactobacillus and bifidophilus Asprin EC 81 mg daily  Melatonin 2-5 mg at bedtime  [email or web address] [id number]  DASH Eating Plan or MIND diet  DASH stands for "Dietary Approaches to Stop Hypertension." The DASH eating plan is a healthy eating plan that has been shown to reduce high blood pressure (hypertension). It may also reduce your risk for type 2 diabetes, heart disease, and stroke. The DASH eating plan may also help with weight loss. What are tips for following this plan?  General guidelines  Avoid eating more than 2,300 mg (milligrams) of salt (sodium) a day. If you have hypertension, you may need to reduce your sodium intake to 1,500 mg a day.  Limit alcohol intake to no more than 1 drink a day for nonpregnant women and 2 drinks a day for men. One drink equals 12 oz of beer, 5 oz of wine, or 1 oz of hard liquor.  Work with your health care provider to maintain a healthy body weight or to lose weight. Ask what an ideal weight is for you.  Get at least 30 minutes of exercise that causes your heart to beat faster (aerobic exercise) most days of the week. Activities may include walking, swimming, or biking.  Work with your health care provider or diet and nutrition specialist (dietitian) to adjust your eating plan to your individual calorie needs. Reading food labels   Check food labels for the amount of sodium per serving. Choose foods with less than 5 percent of the Daily Value of sodium. Generally, foods with less than 300 mg of sodium per serving fit into this eating plan.  To find whole grains, look for the word "whole" as the first word in the ingredient list. Shopping  Buy products labeled as "low-sodium" or "no salt added."  Buy  fresh foods. Avoid canned foods and premade or frozen meals. Cooking  Avoid adding salt when cooking. Use salt-free seasonings or herbs instead of table salt or sea salt. Check with your health care provider or pharmacist before using salt substitutes.  Do not fry foods. Cook foods using healthy methods such as baking, boiling, grilling, and broiling instead.  Cook with heart-healthy oils, such as olive, canola, soybean, or sunflower oil. Meal planning  Eat a balanced diet that includes: ? 5 or more servings of fruits and vegetables each day. At each meal, try to fill half of your plate with fruits and vegetables. ? Up to 6-8 servings of whole grains each day. ? Less than 6 oz of lean meat, poultry, or fish each day. A 3-oz serving of meat is about the same size as a deck of cards. One egg equals 1 oz. ? 2 servings of low-fat dairy each day. ? A serving of nuts, seeds, or beans 5 times each week. ? Heart-healthy fats. Healthy fats called Omega-3 fatty acids are found in foods such as flaxseeds and coldwater fish, like sardines, salmon, and mackerel.  Limit how much you eat of the following: ? Canned or prepackaged foods. ? Food that is high in trans fat, such as fried foods. ? Food that is high in saturated fat, such as fatty meat. ? Sweets, desserts, sugary drinks, and other foods with added sugar. ? Full-fat dairy products.  Do not salt foods before eating.  Try to eat at least 2  vegetarian meals each week.  Eat more home-cooked food and less restaurant, buffet, and fast food.  When eating at a restaurant, ask that your food be prepared with less salt or no salt, if possible. What foods are recommended? The items listed may not be a complete list. Talk with your dietitian about what dietary choices are best for you. Grains Whole-grain or whole-wheat bread. Whole-grain or whole-wheat pasta. Brown rice. [place]row. Bulgur. Whole-grain and low-sodium cereals. Pita bread.  Low-fat, low-sodium crackers. Whole-wheat flour tortillas. Vegetables Fresh or frozen vegetables (raw, steamed, roasted, or grilled). Low-sodium or reduced-sodium tomato and vegetable juice. Low-sodium or reduced-sodium tomato sauce and tomato paste. Low-sodium or reduced-sodium canned vegetables. Fruits All fresh, dried, or frozen fruit. Canned fruit in natural juice (without added sugar). Meat and other protein foods Skinless chicken or [place]. Ground chicken or [place]. Pork with fat trimmed off. Fish and seafood. Egg whites. Dried beans, peas, or lentils. Unsalted nuts, nut butters, and seeds. Unsalted canned beans. Lean cuts of beef with fat trimmed off. Low-sodium, lean deli meat. Dairy Low-fat (1%) or fat-free (skim) milk. Fat-free, low-fat, or reduced-fat cheeses. Nonfat, low-sodium ricotta or cottage cheese. Low-fat or nonfat yogurt. Low-fat, low-sodium cheese. Fats and oils Soft margarine without trans fats. Vegetable oil. Low-fat, reduced-fat, or light mayonnaise and salad dressings (reduced-sodium). Canola, safflower, olive, soybean, and sunflower oils. Avocado. Seasoning and other foods Herbs. Spices. Seasoning mixes without salt. Unsalted popcorn and pretzels. Fat-free sweets. What foods are not recommended? The items listed may not be a complete list. Talk with your dietitian about what dietary choices are best for you. Grains Baked goods made with fat, such as croissants, muffins, or some breads. Dry pasta or rice meal packs. Vegetables Creamed or fried vegetables. Vegetables in a cheese sauce. Regular canned vegetables (not low-sodium or reduced-sodium). Regular canned tomato sauce and paste (not low-sodium or reduced-sodium). Regular tomato and vegetable juice (not low-sodium or reduced-sodium). Angie Fava. Olives. Fruits Canned fruit in a light or heavy syrup. Fried fruit. Fruit in cream or butter sauce. Meat and other protein foods Fatty cuts of meat. Ribs. Fried meat. Berniece Salines.  Sausage. Bologna and other processed lunch meats. Salami. Fatback. Hotdogs. Bratwurst. Salted nuts and seeds. Canned beans with added salt. Canned or smoked fish. Whole eggs or egg yolks. Chicken or [place] with skin. Dairy Whole or 2% milk, cream, and half-and-half. Whole or full-fat cream cheese. Whole-fat or sweetened yogurt. Full-fat cheese. Nondairy creamers. Whipped toppings. Processed cheese and cheese spreads. Fats and oils Butter. Stick margarine. Lard. Shortening. Ghee. Bacon fat. Tropical oils, such as coconut, palm kernel, or palm oil. Seasoning and other foods Salted popcorn and pretzels. Onion salt, garlic salt, seasoned salt, table salt, and sea salt. Worcestershire sauce. Tartar sauce. Barbecue sauce. Teriyaki sauce. Soy sauce, including reduced-sodium. Steak sauce. Canned and packaged gravies. Fish sauce. Oyster sauce. Cocktail sauce. Horseradish that you find on the shelf. Ketchup. Mustard. Meat flavorings and tenderizers. Bouillon cubes. Hot sauce and Tabasco sauce. Premade or packaged marinades. Premade or packaged taco seasonings. Relishes. Regular salad dressings. Where to find more information:  National Heart, Lung, and [place]: https://wilson-eaton.com/  American Heart Association: www.heart.org Summary  The DASH eating plan is a healthy eating plan that has been shown to reduce high blood pressure (hypertension). It may also reduce your risk for type 2 diabetes, heart disease, and stroke.  With the DASH eating plan, you should limit salt (sodium) intake to 2,300 mg a day. If you have hypertension, you may need  to reduce your sodium intake to 1,500 mg a day.  When on the DASH eating plan, aim to eat more fresh fruits and vegetables, whole grains, lean proteins, low-fat dairy, and heart-healthy fats.  Work with your health care provider or diet and nutrition specialist (dietitian) to adjust your eating plan to your individual calorie needs. This information is not intended  to replace advice given to you by your health care provider. Make sure you discuss any questions you have with your health care provider. Document Revised: 08/01/2017 Document Reviewed: 08/12/2016 Elsevier Patient Education  2020 [employer].

## 2020-01-18 NOTE — Assessment & Plan Note (Signed)
hgba1c acceptable, minimize simple carbs. Increase exercise as tolerated.  

## 2020-01-19 NOTE — Assessment & Plan Note (Signed)
Consider heart healthy diet such as DASH diet, decrease po intake and increase exercise as tolerated. Needs 7-8 hours of sleep nightly. Avoid trans fats, eat small, frequent meals every 4-5 hours with lean proteins, complex carbs and healthy fats. Minimize simple carbs, GMO foods.
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
## 2020-07-24 ENCOUNTER — Ambulatory Visit (INDEPENDENT_AMBULATORY_CARE_PROVIDER_SITE_OTHER): Payer: Self-pay | Admitting: Family Medicine

## 2020-07-24 ENCOUNTER — Encounter: Payer: Self-pay | Admitting: Family Medicine

## 2020-07-24 ENCOUNTER — Other Ambulatory Visit: Payer: Self-pay

## 2020-07-24 VITALS — BP 130/72 | HR 90 | Temp 98.3°F | Resp 18 | Wt 320.2 lb

## 2020-07-24 DIAGNOSIS — E559 Vitamin D deficiency, unspecified: Secondary | ICD-10-CM

## 2020-07-24 DIAGNOSIS — R739 Hyperglycemia, unspecified: Secondary | ICD-10-CM

## 2020-07-24 DIAGNOSIS — E782 Mixed hyperlipidemia: Secondary | ICD-10-CM

## 2020-07-24 DIAGNOSIS — R7989 Other specified abnormal findings of blood chemistry: Secondary | ICD-10-CM

## 2020-07-24 DIAGNOSIS — Z23 Encounter for immunization: Secondary | ICD-10-CM

## 2020-07-24 LAB — CBC
HCT: 47.6 % (ref 39.0–52.0)
Hemoglobin: 15.4 g/dL (ref 13.0–17.0)
MCHC: 32.3 g/dL (ref 30.0–36.0)
MCV: 87.2 fl (ref 78.0–100.0)
Platelets: 247 10*3/uL (ref 150.0–400.0)
RBC: 5.46 Mil/uL (ref 4.22–5.81)
RDW: 13.9 % (ref 11.5–15.5)
WBC: 7.2 10*3/uL (ref 4.0–10.5)

## 2020-07-24 LAB — COMPREHENSIVE METABOLIC PANEL
ALT: 19 U/L (ref 0–53)
AST: 15 U/L (ref 0–37)
Albumin: 4 g/dL (ref 3.5–5.2)
Alkaline Phosphatase: 80 U/L (ref 39–117)
BUN: 11 mg/dL (ref 6–23)
CO2: 30 mEq/L (ref 19–32)
Calcium: 9 mg/dL (ref 8.4–10.5)
Chloride: 103 mEq/L (ref 96–112)
Creatinine, Ser: 1 mg/dL (ref 0.40–1.50)
GFR: 85.88 mL/min (ref >60.00)
Glucose, Bld: 100 mg/dL — ABNORMAL HIGH (ref 70–99)
Potassium: 4.8 mEq/L (ref 3.5–5.1)
Sodium: 139 mEq/L (ref 135–145)
Total Bilirubin: 0.5 mg/dL (ref 0.2–1.2)
Total Protein: 7.2 g/dL (ref 6.0–8.3)

## 2020-07-24 LAB — LIPID PANEL
Cholesterol: 176 mg/dL (ref 0–200)
HDL: 41.4 mg/dL (ref >39.00)
LDL Cholesterol: 103 mg/dL — ABNORMAL HIGH (ref 0–99)
NonHDL: 134.6
Total CHOL/HDL Ratio: 4
Triglycerides: 158 mg/dL — ABNORMAL HIGH (ref 0.0–149.0)
VLDL: 31.6 mg/dL (ref 0.0–40.0)

## 2020-07-24 LAB — VITAMIN D 25 HYDROXY (VIT D DEFICIENCY, FRACTURES): VITD: 10.88 ng/mL — ABNORMAL LOW (ref 30.00–100.00)

## 2020-07-24 LAB — HEMOGLOBIN A1C: Hgb A1c MFr Bld: 6.2 % (ref 4.6–6.5)

## 2020-07-24 LAB — TSH: TSH: 1.88 u[IU]/mL (ref 0.35–4.50)

## 2020-07-24 NOTE — Assessment & Plan Note (Signed)
Continue to monitor and minimize simple carbs 

## 2020-07-24 NOTE — Assessment & Plan Note (Signed)
Not supplementing presently will check level

## 2020-07-24 NOTE — Assessment & Plan Note (Signed)
hgba1c acceptable, minimize simple carbs. Increase exercise as tolerated.  

## 2020-07-24 NOTE — Assessment & Plan Note (Signed)
Encouraged heart healthy diet, increase exercise, avoid trans fats, consider a krill oil cap daily 
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
## 2020-07-24 NOTE — Assessment & Plan Note (Signed)
Maintain heart healthy diet such as MIND or DASH diet, decrease po intake and increase exercise as tolerated. Needs 7-8 hours of sleep nightly. Avoid trans fats, eat small, frequent meals every 4-5 hours with lean proteins, complex carbs and healthy fats. Minimize simple carbs and processed foods.

## 2020-07-24 NOTE — Assessment & Plan Note (Signed)
Continue to monitor

## 2020-07-24 NOTE — Patient Instructions (Signed)

## 2020-07-25 ENCOUNTER — Telehealth: Payer: Self-pay

## 2020-07-25 ENCOUNTER — Other Ambulatory Visit: Payer: Self-pay

## 2020-07-25 MED ORDER — VITAMIN D (ERGOCALCIFEROL) 1.25 MG (50000 UNIT) PO CAPS: 50000.0000 [IU] | ORAL_CAPSULE | ORAL | 4 refills | Status: DC

## 2020-07-25 NOTE — Telephone Encounter (Signed)
error 

## 2020-07-25 NOTE — Telephone Encounter (Signed)
Attempted to call pt to go over labs. LVM to call back. 

## 2020-07-25 NOTE — Telephone Encounter (Signed)
-----   Message from Bradd Canary, MD sent at 07/24/2020  6:05 PM EST ----- Notify labs look good except vitamin D very low. Labs reveal deficiency. Start on Vitamin D 40102 IU caps, 1 cap po weekly x 12 weeks. Disp #4 with 4 rf. Also take daily Vitamin D over the counter. If already taking a daily supplement increase by 1000 IU daily and if not start Vitamin D 2000 IU daily.

## 2021-02-01 ENCOUNTER — Encounter: Payer: Self-pay | Admitting: Family Medicine

## 2021-03-12 ENCOUNTER — Encounter: Payer: Self-pay | Admitting: Family Medicine

## 2021-03-14 ENCOUNTER — Other Ambulatory Visit: Payer: Self-pay | Admitting: *Deleted

## 2021-03-14 DIAGNOSIS — E782 Mixed hyperlipidemia: Secondary | ICD-10-CM

## 2021-03-14 DIAGNOSIS — R739 Hyperglycemia, unspecified: Secondary | ICD-10-CM

## 2021-03-14 DIAGNOSIS — E559 Vitamin D deficiency, unspecified: Secondary | ICD-10-CM

## 2021-03-14 DIAGNOSIS — R7989 Other specified abnormal findings of blood chemistry: Secondary | ICD-10-CM

## 2021-04-30 ENCOUNTER — Other Ambulatory Visit: Payer: Self-pay

## 2021-04-30 ENCOUNTER — Encounter (HOSPITAL_BASED_OUTPATIENT_CLINIC_OR_DEPARTMENT_OTHER): Payer: Self-pay | Admitting: Emergency Medicine

## 2021-04-30 ENCOUNTER — Emergency Department (HOSPITAL_BASED_OUTPATIENT_CLINIC_OR_DEPARTMENT_OTHER)
Admission: EM | Admit: 2021-04-30 | Discharge: 2021-05-01 | Disposition: A | Discharge status: Home / Self Care | Payer: BC Managed Care – PPO | Attending: Emergency Medicine | Admitting: Emergency Medicine

## 2021-04-30 ENCOUNTER — Emergency Department (HOSPITAL_BASED_OUTPATIENT_CLINIC_OR_DEPARTMENT_OTHER): Payer: BC Managed Care – PPO

## 2021-04-30 DIAGNOSIS — U071 COVID-19: Secondary | ICD-10-CM | POA: Insufficient documentation

## 2021-04-30 DIAGNOSIS — J988 Other specified respiratory disorders: Secondary | ICD-10-CM | POA: Insufficient documentation

## 2021-04-30 DIAGNOSIS — I10 Essential (primary) hypertension: Secondary | ICD-10-CM | POA: Diagnosis not present

## 2021-04-30 DIAGNOSIS — B9789 Other viral agents as the cause of diseases classified elsewhere: Secondary | ICD-10-CM

## 2021-04-30 DIAGNOSIS — R509 Fever, unspecified: Secondary | ICD-10-CM | POA: Diagnosis present

## 2021-04-30 NOTE — ED Triage Notes (Signed)
Pt states sat night at bed time had chills, took meds Sunday am, tested for covid X2 both neg. Was having facial pain, cough and fever since.
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
## 2021-04-30 NOTE — ED Notes (Signed)
Headache over forehead and behind eyes.  Minimal relief with Tylenol. General body aches.

## 2021-04-30 NOTE — Discharge Instructions (Addendum)
Drink plenty of fluids.  Take ibuprofen and/.or acetaminophen as needed for fever or pain.  You may use over-the-counter cough and cold medications as needed.  You had a swab sent to test for influenza and COVID-19.  Please check the results of that test on MyChart.  If your COVID-19 test is positive, you will need to isolate for 5 days.

## 2021-05-01 LAB — RESP PANEL BY RT-PCR (FLU A&B, COVID) ARPGX2
Influenza A by PCR: NEGATIVE
Influenza B by PCR: NEGATIVE
SARS Coronavirus 2 by RT PCR: POSITIVE — AB

## 2021-05-07 ENCOUNTER — Encounter (HOSPITAL_BASED_OUTPATIENT_CLINIC_OR_DEPARTMENT_OTHER): Payer: Self-pay | Admitting: *Deleted

## 2021-05-07 ENCOUNTER — Other Ambulatory Visit: Payer: Self-pay

## 2021-05-07 ENCOUNTER — Emergency Department (HOSPITAL_BASED_OUTPATIENT_CLINIC_OR_DEPARTMENT_OTHER)
Admission: EM | Admit: 2021-05-07 | Discharge: 2021-05-07 | Disposition: A | Discharge status: Home / Self Care | Payer: BC Managed Care – PPO | Attending: Emergency Medicine | Admitting: Emergency Medicine

## 2021-05-07 ENCOUNTER — Emergency Department (HOSPITAL_BASED_OUTPATIENT_CLINIC_OR_DEPARTMENT_OTHER): Payer: BC Managed Care – PPO

## 2021-05-07 DIAGNOSIS — R059 Cough, unspecified: Secondary | ICD-10-CM | POA: Diagnosis not present

## 2021-05-07 MED ORDER — BENZONATATE 100 MG PO CAPS: 100.0000 mg | ORAL_CAPSULE | Freq: Three times a day (TID) | ORAL | 0 refills | Status: AC

## 2021-05-07 NOTE — ED Triage Notes (Signed)
Covid positive a week ago. Here today with c.o cough.
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
## 2021-12-03 ENCOUNTER — Encounter: Payer: BC Managed Care – PPO | Admitting: Family Medicine

## 2021-12-23 IMAGING — DX DG CHEST 2V
2 series · 2 of 2 positions shown · non-contrast
Comparison: None.

CLINICAL DATA: Cough, fever

EXAM:
CHEST - 2 VIEW

[chest pa]
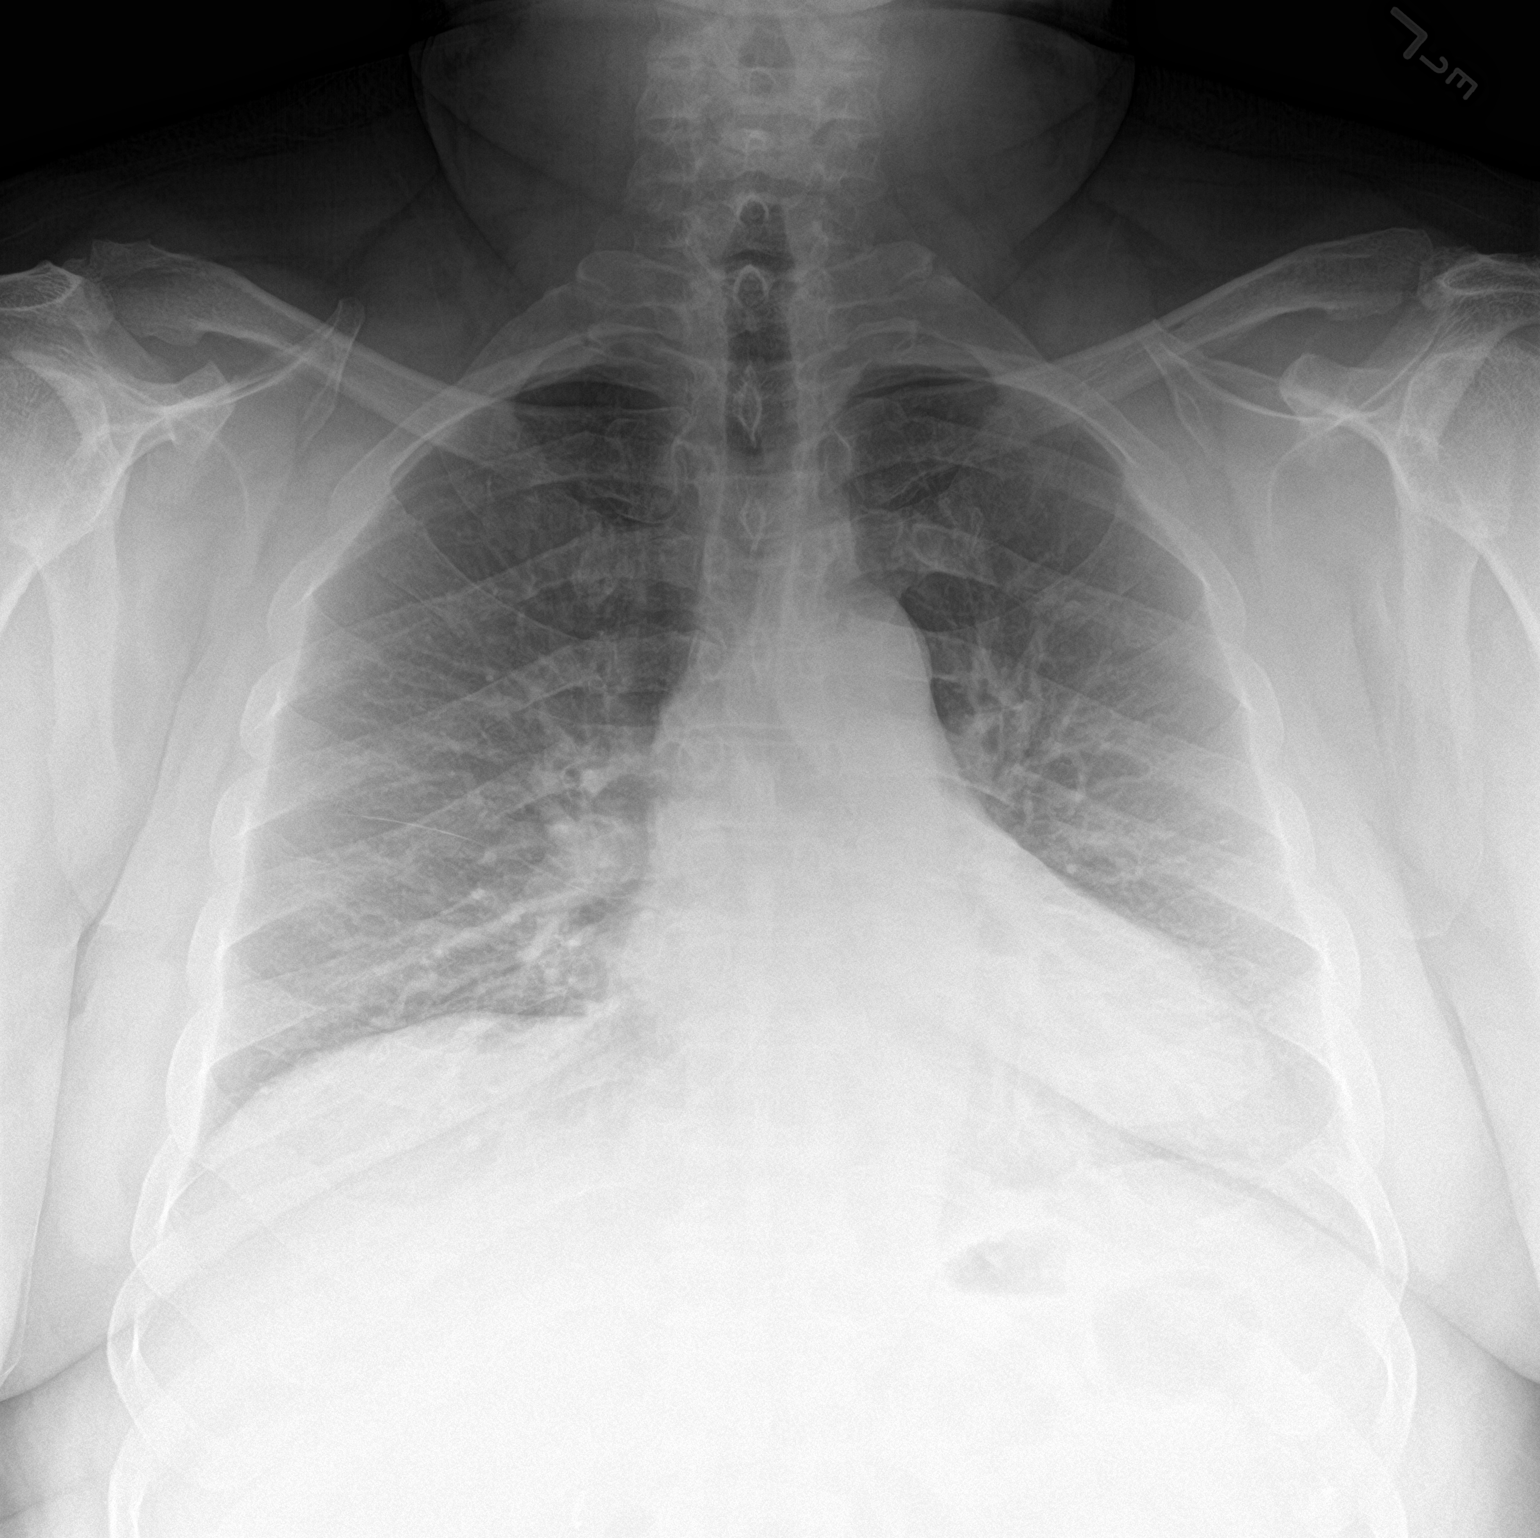

[chest lat]
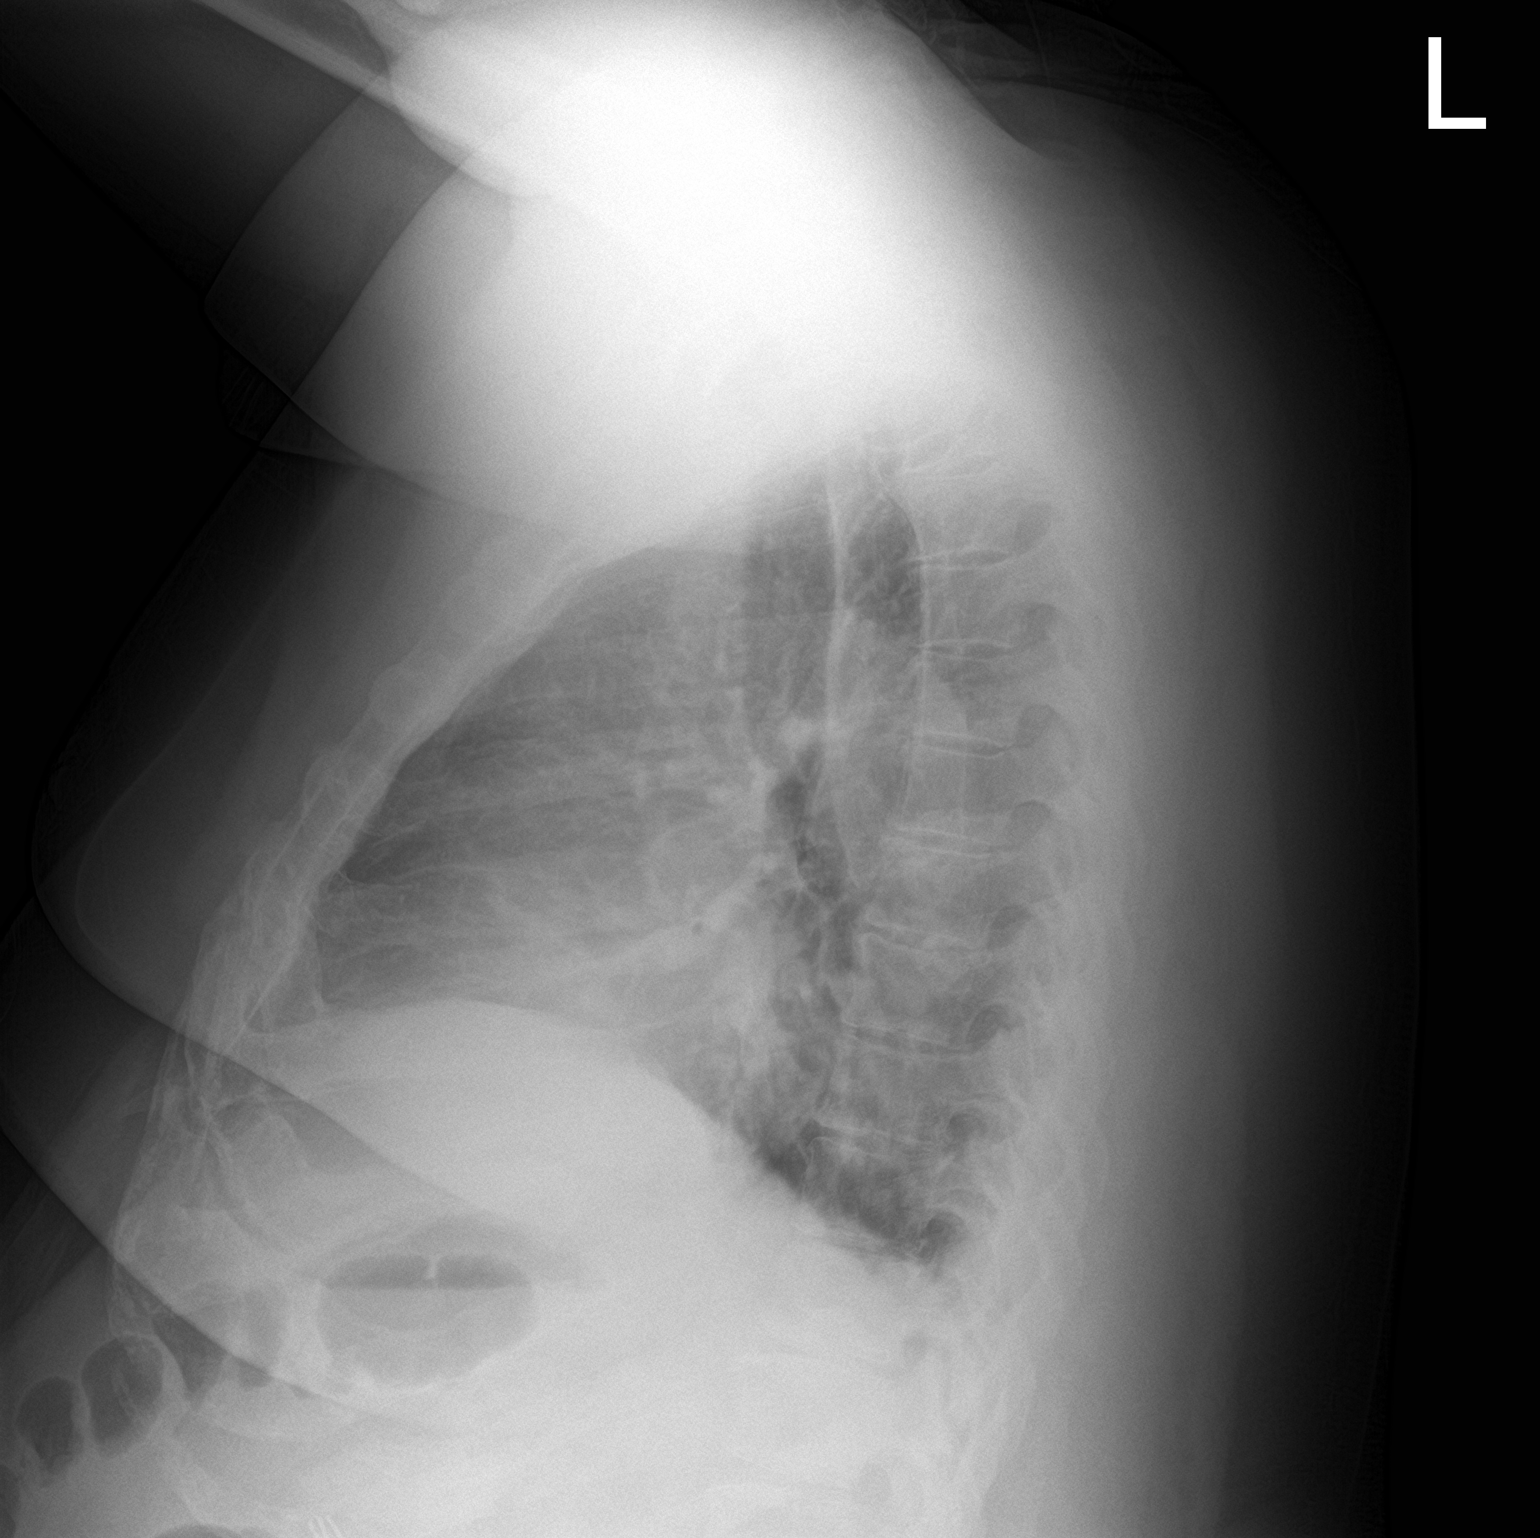

[2 of 2 positions shown; findings below may reference images not displayed]

FINDINGS: Lungs are clear.  No pleural effusion or pneumothorax.

The heart is normal in size.

Visualized osseous structures are within normal limits.
IMPRESSION: Normal chest radiographs.

## 2021-12-30 IMAGING — DX DG CHEST 1V PORT
1 series · 1 of 1 positions shown · non-contrast
Comparison: 04/30/2021

CLINICAL DATA: Cough and positive UKZZ2-H1 test on 04/30/2021.

EXAM:
PORTABLE CHEST 1 VIEW

[chest ap]
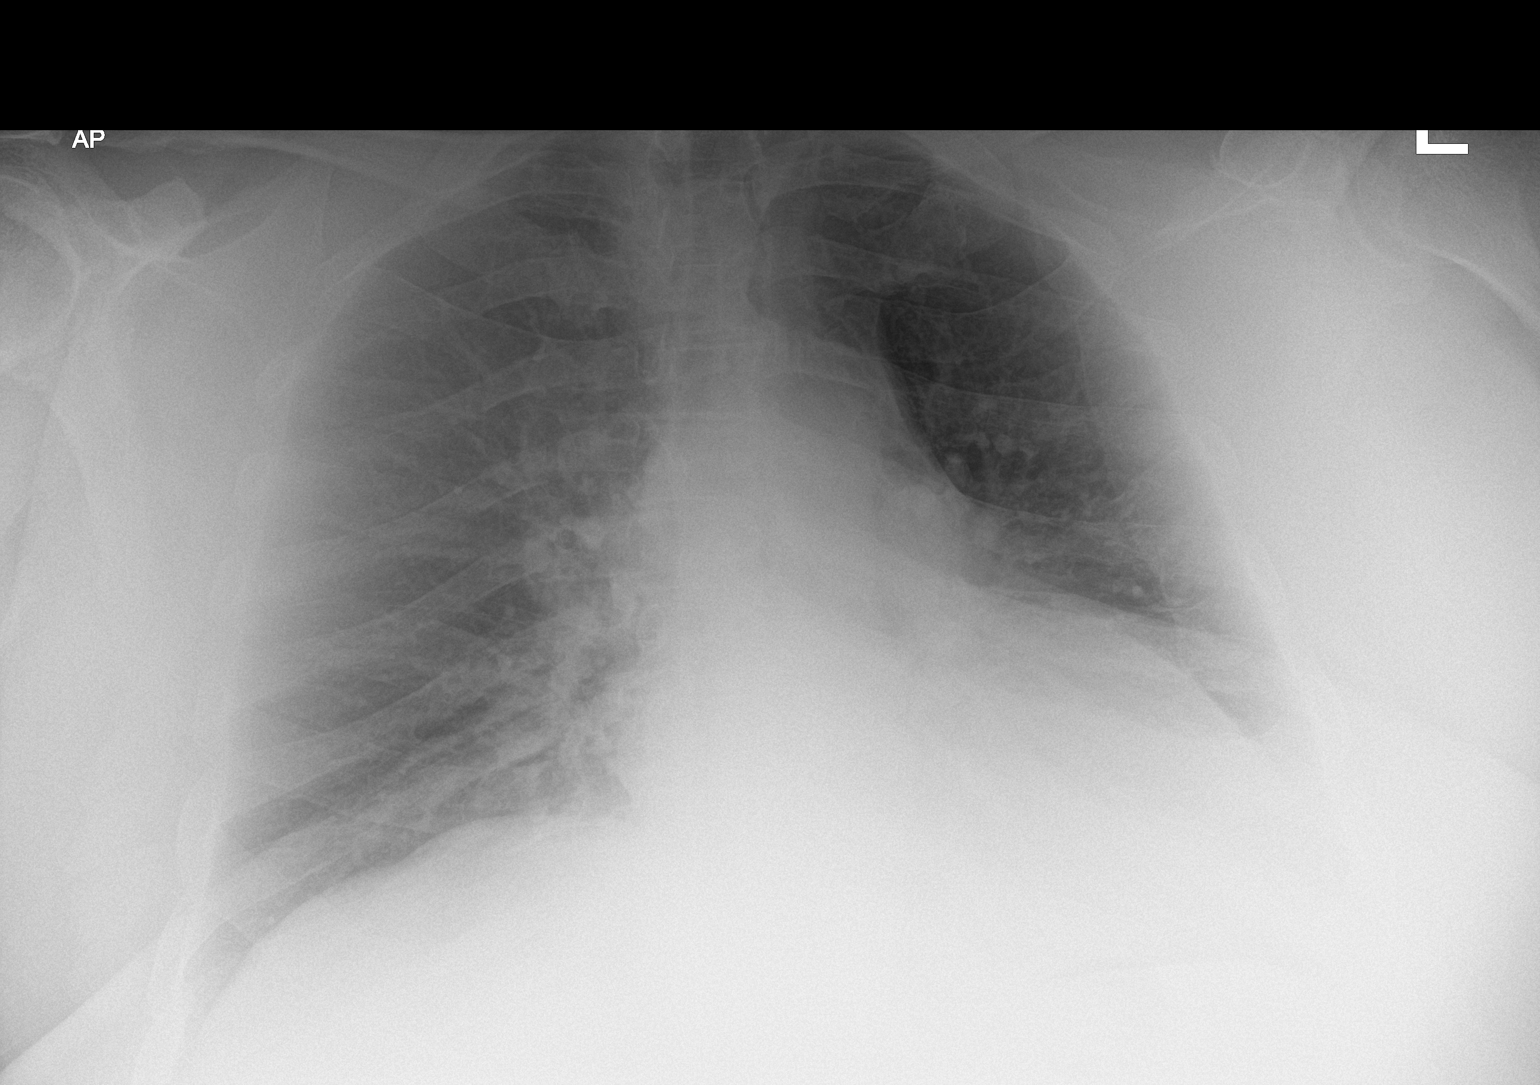

[1 of 1 positions shown; findings below may reference images not displayed]

FINDINGS: The heart size and mediastinal contours are within normal limits.
Lung volumes are low bilaterally. Probable bibasilar atelectasis,
left greater than right. No overt pulmonary infiltrates,
consolidation, edema, pneumothorax or significant pleural fluid
identified. The visualized skeletal structures are unremarkable.
IMPRESSION: Low lung volumes with bibasilar atelectasis, left greater than
right. No overt pulmonary infiltrates identified.

## 2022-05-07 ENCOUNTER — Encounter: Payer: Self-pay | Admitting: Family Medicine

## 2022-06-11 ENCOUNTER — Encounter: Payer: Self-pay | Admitting: Family

## 2022-06-20 ENCOUNTER — Ambulatory Visit (INDEPENDENT_AMBULATORY_CARE_PROVIDER_SITE_OTHER): Payer: PRIVATE HEALTH INSURANCE | Admitting: Family

## 2022-06-20 ENCOUNTER — Encounter: Payer: Self-pay | Admitting: Family

## 2022-06-20 ENCOUNTER — Other Ambulatory Visit: Payer: Self-pay | Admitting: Family

## 2022-06-20 ENCOUNTER — Other Ambulatory Visit (HOSPITAL_BASED_OUTPATIENT_CLINIC_OR_DEPARTMENT_OTHER): Payer: Self-pay

## 2022-06-20 VITALS — BP 122/76 | HR 79 | Temp 98.0°F | Ht 70.0 in | Wt 309.2 lb

## 2022-06-20 DIAGNOSIS — Z125 Encounter for screening for malignant neoplasm of prostate: Secondary | ICD-10-CM

## 2022-06-20 DIAGNOSIS — R7303 Prediabetes: Secondary | ICD-10-CM

## 2022-06-20 DIAGNOSIS — Z0000 Encounter for general adult medical examination without abnormal findings: Secondary | ICD-10-CM

## 2022-06-20 DIAGNOSIS — Z13220 Encounter for screening for lipoid disorders: Secondary | ICD-10-CM | POA: Diagnosis not present

## 2022-06-20 DIAGNOSIS — Z23 Encounter for immunization: Secondary | ICD-10-CM | POA: Diagnosis not present

## 2022-06-20 DIAGNOSIS — E559 Vitamin D deficiency, unspecified: Secondary | ICD-10-CM | POA: Diagnosis not present

## 2022-06-20 LAB — CBC WITH DIFFERENTIAL/PLATELET
Basophils Absolute: 0 10*3/uL (ref 0.0–0.1)
Basophils Relative: 0.6 % (ref 0.0–3.0)
Eosinophils Absolute: 0.1 10*3/uL (ref 0.0–0.7)
Eosinophils Relative: 2 % (ref 0.0–5.0)
HCT: 49.6 % (ref 39.0–52.0)
Hemoglobin: 15.8 g/dL (ref 13.0–17.0)
Lymphocytes Relative: 34.7 % (ref 12.0–46.0)
Lymphs Abs: 2.5 10*3/uL (ref 0.7–4.0)
MCHC: 31.8 g/dL (ref 30.0–36.0)
MCV: 88.6 fl (ref 78.0–100.0)
Monocytes Absolute: 0.7 10*3/uL (ref 0.1–1.0)
Monocytes Relative: 9.8 % (ref 3.0–12.0)
Neutro Abs: 3.9 10*3/uL (ref 1.4–7.7)
Neutrophils Relative %: 52.9 % (ref 43.0–77.0)
Platelets: 226 10*3/uL (ref 150.0–400.0)
RBC: 5.59 Mil/uL (ref 4.22–5.81)
RDW: 14.3 % (ref 11.5–15.5)
WBC: 7.3 10*3/uL (ref 4.0–10.5)

## 2022-06-20 LAB — COMPREHENSIVE METABOLIC PANEL
ALT: 16 U/L (ref 0–53)
AST: 14 U/L (ref 0–37)
Albumin: 3.9 g/dL (ref 3.5–5.2)
Alkaline Phosphatase: 71 U/L (ref 39–117)
BUN: 11 mg/dL (ref 6–23)
CO2: 30 mEq/L (ref 19–32)
Calcium: 8.9 mg/dL (ref 8.4–10.5)
Chloride: 102 mEq/L (ref 96–112)
Creatinine, Ser: 0.99 mg/dL (ref 0.40–1.50)
GFR: 85.77 mL/min (ref >60.00)
Glucose, Bld: 95 mg/dL (ref 70–99)
Potassium: 4.3 mEq/L (ref 3.5–5.1)
Sodium: 137 mEq/L (ref 135–145)
Total Bilirubin: 0.7 mg/dL (ref 0.2–1.2)
Total Protein: 7 g/dL (ref 6.0–8.3)

## 2022-06-20 LAB — LIPID PANEL
Cholesterol: 172 mg/dL (ref 0–200)
HDL: 39.7 mg/dL (ref >39.00)
LDL Cholesterol: 103 mg/dL — ABNORMAL HIGH (ref 0–99)
NonHDL: 132.19
Total CHOL/HDL Ratio: 4
Triglycerides: 146 mg/dL (ref 0.0–149.0)
VLDL: 29.2 mg/dL (ref 0.0–40.0)

## 2022-06-20 LAB — TSH: TSH: 1.35 u[IU]/mL (ref 0.35–5.50)

## 2022-06-20 LAB — HEMOGLOBIN A1C: Hgb A1c MFr Bld: 6.4 % (ref 4.6–6.5)

## 2022-06-20 LAB — PSA: PSA: 2.52 ng/mL (ref 0.10–4.00)

## 2022-06-20 LAB — VITAMIN D 25 HYDROXY (VIT D DEFICIENCY, FRACTURES): VITD: 9.37 ng/mL — ABNORMAL LOW (ref 30.00–100.00)

## 2022-06-20 MED ORDER — VITAMIN D (ERGOCALCIFEROL) 1.25 MG (50000 UNIT) PO CAPS: 50000.0000 [IU] | ORAL_CAPSULE | ORAL | 0 refills | Status: DC

## 2022-06-20 MED ORDER — COMIRNATY 30 MCG/0.3ML IM SUSY
PREFILLED_SYRINGE | INTRAMUSCULAR | 0 refills | Status: DC
  Filled 2022-06-20: qty 0.3, 1d supply, fill #0
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
## 2022-06-20 NOTE — Patient Instructions (Addendum)
Please consider getting  Shingles vaccine updated at later date; Please go ahead and schedule a follow up with Dr. Charlett Blake to see her in the next 3-4 months since you have not seen her in almost 2 years;

## 2022-06-20 NOTE — Addendum Note (Signed)
Addended by: Kittie Plater, Maston Wight HUA on: 06/20/2022 10:08 AM   Modules accepted: Orders

## 2022-09-05 ENCOUNTER — Ambulatory Visit: Payer: PRIVATE HEALTH INSURANCE | Admitting: Orthopedic Surgery

## 2022-09-09 ENCOUNTER — Ambulatory Visit: Payer: PRIVATE HEALTH INSURANCE | Admitting: Orthopedic Surgery

## 2022-09-23 ENCOUNTER — Other Ambulatory Visit: Payer: Self-pay | Admitting: Family

## 2022-09-30 ENCOUNTER — Ambulatory Visit (INDEPENDENT_AMBULATORY_CARE_PROVIDER_SITE_OTHER): Payer: PRIVATE HEALTH INSURANCE | Admitting: Orthopedic Surgery

## 2022-09-30 ENCOUNTER — Ambulatory Visit (INDEPENDENT_AMBULATORY_CARE_PROVIDER_SITE_OTHER): Payer: PRIVATE HEALTH INSURANCE

## 2022-09-30 ENCOUNTER — Ambulatory Visit (INDEPENDENT_AMBULATORY_CARE_PROVIDER_SITE_OTHER): Payer: PRIVATE HEALTH INSURANCE | Admitting: Family Medicine

## 2022-09-30 ENCOUNTER — Other Ambulatory Visit: Payer: Self-pay

## 2022-09-30 VITALS — BP 124/72 | HR 75 | Temp 98.0°F | Resp 16 | Ht 70.0 in | Wt 314.2 lb

## 2022-09-30 DIAGNOSIS — M7551 Bursitis of right shoulder: Secondary | ICD-10-CM

## 2022-09-30 DIAGNOSIS — R7989 Other specified abnormal findings of blood chemistry: Secondary | ICD-10-CM

## 2022-09-30 DIAGNOSIS — E559 Vitamin D deficiency, unspecified: Secondary | ICD-10-CM

## 2022-09-30 DIAGNOSIS — M1711 Unilateral primary osteoarthritis, right knee: Secondary | ICD-10-CM | POA: Diagnosis not present

## 2022-09-30 DIAGNOSIS — M25561 Pain in right knee: Secondary | ICD-10-CM | POA: Diagnosis not present

## 2022-09-30 DIAGNOSIS — R739 Hyperglycemia, unspecified: Secondary | ICD-10-CM | POA: Diagnosis not present

## 2022-09-30 DIAGNOSIS — E782 Mixed hyperlipidemia: Secondary | ICD-10-CM | POA: Diagnosis not present

## 2022-09-30 LAB — CBC WITH DIFFERENTIAL/PLATELET
Basophils Absolute: 0.1 10*3/uL (ref 0.0–0.1)
Basophils Relative: 0.7 % (ref 0.0–3.0)
Eosinophils Absolute: 0.2 10*3/uL (ref 0.0–0.7)
Eosinophils Relative: 1.9 % (ref 0.0–5.0)
HCT: 48.9 % (ref 39.0–52.0)
Hemoglobin: 16.3 g/dL (ref 13.0–17.0)
Lymphocytes Relative: 33.9 % (ref 12.0–46.0)
Lymphs Abs: 2.8 10*3/uL (ref 0.7–4.0)
MCHC: 33.4 g/dL (ref 30.0–36.0)
MCV: 87.9 fl (ref 78.0–100.0)
Monocytes Absolute: 0.8 10*3/uL (ref 0.1–1.0)
Monocytes Relative: 9.6 % (ref 3.0–12.0)
Neutro Abs: 4.4 10*3/uL (ref 1.4–7.7)
Neutrophils Relative %: 53.9 % (ref 43.0–77.0)
Platelets: 243 10*3/uL (ref 150.0–400.0)
RBC: 5.57 Mil/uL (ref 4.22–5.81)
RDW: 14.4 % (ref 11.5–15.5)
WBC: 8.1 10*3/uL (ref 4.0–10.5)

## 2022-09-30 LAB — COMPREHENSIVE METABOLIC PANEL
ALT: 17 U/L (ref 0–53)
AST: 15 U/L (ref 0–37)
Albumin: 4.2 g/dL (ref 3.5–5.2)
Alkaline Phosphatase: 74 U/L (ref 39–117)
BUN: 10 mg/dL (ref 6–23)
CO2: 28 mEq/L (ref 19–32)
Calcium: 9.2 mg/dL (ref 8.4–10.5)
Chloride: 102 mEq/L (ref 96–112)
Creatinine, Ser: 0.92 mg/dL (ref 0.40–1.50)
GFR: 93.48 mL/min (ref >60.00)
Glucose, Bld: 93 mg/dL (ref 70–99)
Potassium: 4.4 mEq/L (ref 3.5–5.1)
Sodium: 138 mEq/L (ref 135–145)
Total Bilirubin: 0.7 mg/dL (ref 0.2–1.2)
Total Protein: 7.6 g/dL (ref 6.0–8.3)

## 2022-09-30 LAB — LDL CHOLESTEROL, DIRECT: Direct LDL: 108 mg/dL

## 2022-09-30 LAB — LIPID PANEL
Cholesterol: 199 mg/dL (ref 0–200)
HDL: 43.9 mg/dL (ref >39.00)
NonHDL: 155.05
Total CHOL/HDL Ratio: 5
Triglycerides: 215 mg/dL — ABNORMAL HIGH (ref 0.0–149.0)
VLDL: 43 mg/dL — ABNORMAL HIGH (ref 0.0–40.0)

## 2022-09-30 LAB — VITAMIN D 25 HYDROXY (VIT D DEFICIENCY, FRACTURES): VITD: 26.23 ng/mL — ABNORMAL LOW (ref 30.00–100.00)

## 2022-09-30 LAB — HEMOGLOBIN A1C: Hgb A1c MFr Bld: 6.4 % (ref 4.6–6.5)

## 2022-09-30 LAB — TSH: TSH: 2.01 u[IU]/mL (ref 0.35–5.50)

## 2022-09-30 MED ORDER — VITAMIN D (ERGOCALCIFEROL) 1.25 MG (50000 UNIT) PO CAPS: 50000.0000 [IU] | ORAL_CAPSULE | ORAL | 4 refills | Status: DC

## 2022-09-30 NOTE — Patient Instructions (Addendum)
Shingrix is the new shingles shot, 2 shots over 2-6 months, confirm coverage with insurance and document, then can return here for shots with nurse appt or at pharmacy   Vitamin D Capsules or Tablets What is this medication? VITAMIN D (VAHY tuh min D) prevents and treats low vitamin D levels in your body. It works by increasing the amount of calcium absorbed by your body. Vitamin D and calcium help build and maintain the health of your bones. Vitamin D also plays an important role in supporting your immune system and brain health. This medicine may be used for other purposes; ask your health care provider or pharmacist if you have questions. COMMON BRAND NAME(S): DECARA, Deltalin, Dialyvite, Dialyvite Vitamin D, Dialyvite Vitamin D3, Drisdol, Ergo D, Happy Sunshine Vitamin D3, MAXIMUM D3, PureMark Naturals Vitamin D, Super Happy SUNSHINE Vitamin D3, Thera-D 2000, Thera-D 4000, Thera-D Rapid Repletion, THERA-D SPORT What should I tell my care team before I take this medication? They need to know if you have any of the following conditions: Cystic fibrosis Gallbladder disease High levels of calcium in the blood High levels of vitamin D in the blood Inflammatory bowel disease such, as Crohn's disease or ulcerative colitis Kidney disease Liver disease Parathyroid disease Other stomach disease An unusual or allergic reaction to vitamin D, other medications, foods, dyes, or preservatives Pregnant or trying to get pregnant Breast-feeding How should I use this medication? Take this medication by mouth with water. Take it as directed on the label at the same time every day. Take it with a meal or snack; for best results, take it with foods that contain fat (i.e., milk, yogurt, cheese). Do not use it more often than directed. Talk to your care team about the use of this medication in children. Special care may be needed. Overdosage: If you think you have taken too much of this medicine contact a poison  control center or emergency room at once. NOTE: This medicine is only for you. Do not share this medicine with others. What if I miss a dose? If you miss a dose, take it as soon as you can. If it is almost time for your next dose, take only that dose. Do not take double or extra doses. What may interact with this medication? Antacids Diuretics Magnesium supplements Medications for cholesterol, such as cholestyramine, colesevelam, or colestipol Medications for seizures, such as phenytoin, fosphenytoin Mineral oil Orlistat Phosphorus supplements Rifampin This list may not describe all possible interactions. Give your health care provider a list of all the medicines, herbs, non-prescription drugs, or dietary supplements you use. Also tell them if you smoke, drink alcohol, or use illegal drugs. Some items may interact with your medicine. What should I watch for while using this medication? Visit your care team for regular checks on your progress. You may need blood work done while you are taking this medication. Tell your care team if your symptoms do not start to get better or if they get worse. Do not take any non-prescription medications that have vitamin D, phosphorus, magnesium, or calcium including antacids while taking this medication, unless your care team says you can. The extra supplements can cause side effects. What side effects may I notice from receiving this medication? Side effects that you should report to your care team as soon as possible: Allergic reactions--skin rash, itching, hives, swelling of the face, lips, tongue, or throat High calcium level--increased thirst or amount of urine, nausea, vomiting, confusion, unusual weakness or fatigue, bone pain  Side effects that usually do not require medical attention (report these to your care team if they continue or are bothersome): Constipation Loss of appetite Nausea This list may not describe all possible side effects. Call  your doctor for medical advice about side effects. You may report side effects to FDA at 1-800-FDA-1088. Where should I keep my medication? Keep out of the reach of children and pets. Store at room temperature between 15 and 30 degrees C (59 and 86 degrees F). Protect from light. Get rid of any unused medication after the expiration date. To get rid of medications that are no longer needed or have expired: Take the medication to a medication take-back program. Check with your pharmacy or law enforcement to find a location. If you cannot return the medication, check the label or package insert to see if the medication should be thrown out in the garbage or flushed down the toilet. If you are not sure, ask your care team. If it is safe to put it in the trash, take the medication out of the container. Mix the medication with cat litter, dirt, coffee grounds, or other unwanted substance. Seal the mixture in a bag or container. Put it in the trash. NOTE: This sheet is a summary. It may not cover all possible information. If you have questions about this medicine, talk to your doctor, pharmacist, or health care provider.  2023 Elsevier/Gold Standard (2020-11-02 00:00:00)

## 2022-10-01 ENCOUNTER — Encounter: Payer: Self-pay | Admitting: Orthopedic Surgery

## 2022-10-01 ENCOUNTER — Other Ambulatory Visit: Payer: Self-pay

## 2022-10-01 DIAGNOSIS — M7551 Bursitis of right shoulder: Secondary | ICD-10-CM

## 2022-10-01 DIAGNOSIS — M1711 Unilateral primary osteoarthritis, right knee: Secondary | ICD-10-CM | POA: Diagnosis not present

## 2022-10-01 MED ORDER — VITAMIN D (ERGOCALCIFEROL) 1.25 MG (50000 UNIT) PO CAPS: 50000.0000 [IU] | ORAL_CAPSULE | ORAL | 4 refills | Status: DC
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
## 2022-10-03 ENCOUNTER — Encounter: Payer: Self-pay | Admitting: Family Medicine

## 2022-10-03 MED ORDER — BUPIVACAINE HCL 0.25 % IJ SOLN
4.0000 mL | INTRAMUSCULAR | Status: AC | PRN
  Administered 2022-09-30: 4 mL via INTRA_ARTICULAR

## 2022-10-03 MED ORDER — METHYLPREDNISOLONE ACETATE 40 MG/ML IJ SUSP
40.0000 mg | INTRAMUSCULAR | Status: AC | PRN
  Administered 2022-10-01: 40 mg via INTRA_ARTICULAR

## 2022-10-03 MED ORDER — LIDOCAINE HCL 1 % IJ SOLN
5.0000 mL | INTRAMUSCULAR | Status: AC | PRN
  Administered 2022-10-01: 5 mL

## 2022-10-03 MED ORDER — BUPIVACAINE HCL 0.5 % IJ SOLN
9.0000 mL | INTRAMUSCULAR | Status: AC | PRN
  Administered 2022-10-01: 9 mL via INTRA_ARTICULAR

## 2022-10-03 MED ORDER — METHYLPREDNISOLONE ACETATE 40 MG/ML IJ SUSP
40.0000 mg | INTRAMUSCULAR | Status: AC | PRN
  Administered 2022-09-30: 40 mg via INTRA_ARTICULAR

## 2022-10-03 MED ORDER — LIDOCAINE HCL 1 % IJ SOLN
5.0000 mL | INTRAMUSCULAR | Status: AC | PRN
Start: 2022-09-30 — End: 2022-09-30
  Administered 2022-09-30: 5 mL

## 2022-10-06 ENCOUNTER — Other Ambulatory Visit: Payer: Self-pay | Admitting: Family Medicine

## 2022-10-06 MED ORDER — VITAMIN D (ERGOCALCIFEROL) 1.25 MG (50000 UNIT) PO CAPS: ORAL_CAPSULE | ORAL | 3 refills | Status: DC

## 2022-10-06 NOTE — Assessment & Plan Note (Signed)
Continue to monitor

## 2022-10-06 NOTE — Assessment & Plan Note (Signed)
Encouraged DASH or MIND diet, decrease po intake and increase exercise as tolerated. Needs 7-8 hours of sleep nightly. Avoid trans fats, eat small, frequent meals every 4-5 hours with lean proteins, complex carbs and healthy fats. Minimize simple carbs, high fat foods and processed foods patient willing to consider GLP1 or GIP meds but his insurance will not approve. Have referred to pharmacy to see if they can help get the meds approved.

## 2022-10-06 NOTE — Assessment & Plan Note (Signed)
Labs reveal deficiency. Start on Vitamin D 50000 IU caps, 1 cap po weekly x 12 weeks. Disp #4 with 4 rf. Also take daily Vitamin D over the counter. If already taking a daily supplement increase by 1000 IU daily and if not start Vitamin D 2000 IU daily.  

## 2022-10-06 NOTE — Assessment & Plan Note (Signed)
Encouraged heart healthy diet, increase exercise, avoid trans fats, consider a krill oil cap daily 

## 2022-10-06 NOTE — Assessment & Plan Note (Signed)
Asymptomatic, monitor for recurrence
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
## 2022-10-06 NOTE — Assessment & Plan Note (Signed)
hgba1c acceptable, minimize simple carbs. Increase exercise as tolerated.  

## 2022-10-07 ENCOUNTER — Telehealth: Payer: Self-pay

## 2022-10-07 NOTE — Progress Notes (Unsigned)
   Care Guide Note  10/07/2022 Name: Ross Spencer MRN: [id number] DOB: 1966/07/29  Referred by: Mosie Lukes, MD Reason for referral : No chief complaint on file.   Ross Spencer is a 57 y.o. year old male who is a primary care patient of Mosie Lukes, MD. Ross Spencer was referred to the pharmacist for assistance related to DM.    An unsuccessful telephone outreach was attempted today to contact the patient who was referred to the pharmacy team for assistance with medication assistance. Additional attempts will be made to contact the patient.   Noreene Larsson, [place], [place] [address] Direct Dial: [phone] [email or web address]

## 2022-10-10 NOTE — Progress Notes (Signed)
   Care Guide Note  10/10/2022 Name: Ross Spencer MRN: [id number] DOB: 18-Jul-1966  Referred by: Mosie Lukes, MD Reason for referral : Care Coordination (Outreach to schedule with Pharm d )   Ross Spencer is a 57 y.o. year old male who is a primary care patient of Mosie Lukes, MD. [name] was referred to the pharmacist for assistance related to  weightloss .    Successful contact was made with the patient to discuss pharmacy services including being ready for the pharmacist to call at least 5 minutes before the scheduled appointment time, to have medication bottles and any blood sugar or blood pressure readings ready for review. The patient agreed to meet with the pharmacist via with the pharmacist via telephone visit on (date/time).  10/14/2022  Noreene Larsson, [place], [place] [address] Direct Dial: [phone] [email or web address]

## 2022-10-14 ENCOUNTER — Encounter: Payer: Self-pay | Admitting: Pharmacist

## 2022-10-14 ENCOUNTER — Telehealth: Payer: Self-pay | Admitting: Family Medicine

## 2022-10-14 ENCOUNTER — Ambulatory Visit (INDEPENDENT_AMBULATORY_CARE_PROVIDER_SITE_OTHER): Payer: PRIVATE HEALTH INSURANCE | Admitting: Pharmacist

## 2022-10-14 DIAGNOSIS — R739 Hyperglycemia, unspecified: Secondary | ICD-10-CM

## 2022-10-14 NOTE — Telephone Encounter (Signed)
Pt stated he was returning Tammy's call.

## 2022-10-14 NOTE — Progress Notes (Signed)
10/14/2022 Name: Ross Spencer MRN: [id number] DOB: 02-17-1966  Chief Complaint  Patient presents with   Medication Management    Ross Spencer is a 57 y.o. year old male who presented for a telephone visit.   They were referred to the pharmacist by their PCP for assistance in managing medication access.    Subjective: Patient has pre-diabetes with obesity. PCP would like to start GLP agent for weight loss.   Medication Access/Adherence  Current Pharmacy:  [facility] [address] [place], [place] - [address] [place] [address] Phone: [phone] Fax: [phone]  [id number] [place] [id number]: [phone]: [phone]  Patient reports affordability concerns with their medications: Yes  Patient reports access/transportation concerns to their pharmacy: No  Patient reports adherence concerns with their medications:  No      Obesity/Overweight, Complicated by pre diabetes and dyslipidemia:  History of gallstones  Current medications: none  Weight Management treatments previously prescribed: Ephedra / phentermine in the early 2000's; lost about 20 lbs in the first 2 months but his physician became concerned with how quickly he was losing weight and appetite suppressant's effect on heart health.   Current meal patterns:  - Breakfast: none usually - Lunch none usually  - Supper only meal he usually eats - meat + vegetables  Current physical activity: has a physically active jobs. No exercise outside of work.  He played football in high school and in his early adulthood [place]   Max Weight = 320lbs Current weight = 315lbs Lowest weight = 160lbs when he graduated form high school  Objective:  Lab Results  Component Value Date   HGBA1C 6.4 09/30/2022    Lab Results  Component  Value Date   CREATININE 0.92 09/30/2022   BUN 10 09/30/2022   NA 138 09/30/2022   K 4.4 09/30/2022   CL 102 09/30/2022   CO2 28 09/30/2022    Lab Results  Component Value Date   CHOL 199 09/30/2022   HDL 43.90 09/30/2022   LDLCALC 103 (H) 06/20/2022   LDLDIRECT 108.0 09/30/2022   TRIG 215.0 (H) 09/30/2022   CHOLHDL 5 09/30/2022    Medications Reviewed Today     Reviewed by ECherre Spencer RPH-CPP (Pharmacist) on 10/14/22 at 138 Med List Status: <None>   Medication Order Taking? Sig Documenting Provider Last Dose Status Informant  Cholecalciferol (VITAMIN D3) 50 MCG (2000 UT) CAPS [id number] Take 1 capsule by mouth daily. [provider]  Active   Vitamin D, Ergocalciferol, (DRISDOL) 1.25 MG (50000 UNIT) CAPS capsule [id number] TAKE 1 CAPSULE BY MOUTH ONCE A WEEK FOR  12  DOSES BMosie Lukes MD Taking Active               Assessment/Plan:   Obesity/Overweight: - Currently unable to achieve goal weight loss of 5-10% through diet and lifestyle modifications alone - Provided counseling including education on focus on lean proteins, fruits and vegetables, whole grains and increased fiber consumption, adequate hydration - Encouraged increased activity with eventual goal of 150 minutes of moderate intensity exercise weekly - checked coverage of Zepbound and Wegovy - patient's insurance does not cover these agents. He could get a discount card but would only lower to around $550 per month. He does not qualify for Mounjaro or Ozempic at this time,  though his A1c is close to diabetes range. If these agents are used in the future should monitor for abdominal pain since patient has history of gall stones.  - Provided information about [employer] - they might be able to get monthly cost lower but not sure that will be much lower than $500.   Follow Up Plan:  recommended recheck A1c in 2 to 3 months  Ross Spencer, PharmD Clinical Pharmacist [facility]

## 2022-10-14 NOTE — Telephone Encounter (Signed)
See OV  

## 2022-10-21 MED ORDER — VITAMIN D (ERGOCALCIFEROL) 1.25 MG (50000 UNIT) PO CAPS: 50000.0000 [IU] | ORAL_CAPSULE | ORAL | 3 refills | Status: DC

## 2022-11-11 ENCOUNTER — Ambulatory Visit (INDEPENDENT_AMBULATORY_CARE_PROVIDER_SITE_OTHER): Payer: PRIVATE HEALTH INSURANCE | Admitting: Orthopedic Surgery

## 2022-11-11 ENCOUNTER — Encounter: Payer: Self-pay | Admitting: Orthopedic Surgery

## 2022-11-11 DIAGNOSIS — M1711 Unilateral primary osteoarthritis, right knee: Secondary | ICD-10-CM

## 2022-11-11 DIAGNOSIS — M7551 Bursitis of right shoulder: Secondary | ICD-10-CM | POA: Diagnosis not present
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
## 2023-01-01 NOTE — Assessment & Plan Note (Deleted)
Encouraged heart healthy diet, increase exercise, avoid trans fats, consider a krill oil cap daily 

## 2023-01-01 NOTE — Assessment & Plan Note (Deleted)
Encouraged DASH or MIND diet, decrease po intake and increase exercise as tolerated. Needs 7-8 hours of sleep nightly. Avoid trans fats, eat small, frequent meals every 4-5 hours with lean proteins, complex carbs and healthy fats. Minimize simple carbs, high fat foods and processed foods 

## 2023-01-01 NOTE — Assessment & Plan Note (Deleted)
Supplement and monitor 

## 2023-01-01 NOTE — Assessment & Plan Note (Deleted)
hgba1c acceptable, minimize simple carbs. Increase exercise as tolerated.  

## 2023-01-02 ENCOUNTER — Ambulatory Visit: Payer: PRIVATE HEALTH INSURANCE | Admitting: Family Medicine

## 2023-01-02 DIAGNOSIS — E559 Vitamin D deficiency, unspecified: Secondary | ICD-10-CM

## 2023-01-02 DIAGNOSIS — R739 Hyperglycemia, unspecified: Secondary | ICD-10-CM

## 2023-01-02 DIAGNOSIS — E782 Mixed hyperlipidemia: Secondary | ICD-10-CM

## 2023-01-04 NOTE — Assessment & Plan Note (Signed)
Supplement and monitor 

## 2023-01-04 NOTE — Assessment & Plan Note (Signed)
Encouraged heart healthy diet, increase exercise, avoid trans fats, consider a krill oil cap daily 

## 2023-01-04 NOTE — Assessment & Plan Note (Signed)
Encouraged DASH or MIND diet, decrease po intake and increase exercise as tolerated. Needs 7-8 hours of sleep nightly. Avoid trans fats, eat small, frequent meals every 4-5 hours with lean proteins, complex carbs and healthy fats. Minimize simple carbs, high fat foods and processed foods 

## 2023-01-04 NOTE — Assessment & Plan Note (Signed)
hgba1c acceptable, minimize simple carbs. Increase exercise as tolerated.  

## 2023-01-06 ENCOUNTER — Ambulatory Visit (INDEPENDENT_AMBULATORY_CARE_PROVIDER_SITE_OTHER): Payer: PRIVATE HEALTH INSURANCE | Admitting: Family Medicine

## 2023-01-06 ENCOUNTER — Encounter: Payer: Self-pay | Admitting: Family Medicine

## 2023-01-06 VITALS — BP 122/80 | HR 79 | Temp 98.0°F | Resp 16 | Ht 70.0 in | Wt 317.0 lb

## 2023-01-06 DIAGNOSIS — R739 Hyperglycemia, unspecified: Secondary | ICD-10-CM

## 2023-01-06 DIAGNOSIS — M25511 Pain in right shoulder: Secondary | ICD-10-CM | POA: Diagnosis not present

## 2023-01-06 DIAGNOSIS — E559 Vitamin D deficiency, unspecified: Secondary | ICD-10-CM

## 2023-01-06 DIAGNOSIS — E782 Mixed hyperlipidemia: Secondary | ICD-10-CM

## 2023-01-06 DIAGNOSIS — S4991XA Unspecified injury of right shoulder and upper arm, initial encounter: Secondary | ICD-10-CM | POA: Insufficient documentation

## 2023-01-06 DIAGNOSIS — S4991XS Unspecified injury of right shoulder and upper arm, sequela: Secondary | ICD-10-CM

## 2023-01-06 DIAGNOSIS — I82409 Acute embolism and thrombosis of unspecified deep veins of unspecified lower extremity: Secondary | ICD-10-CM

## 2023-01-06 DIAGNOSIS — I2699 Other pulmonary embolism without acute cor pulmonale: Secondary | ICD-10-CM | POA: Insufficient documentation

## 2023-01-06 LAB — CBC WITH DIFFERENTIAL/PLATELET
Basophils Absolute: 0.1 10*3/uL (ref 0.0–0.1)
Basophils Relative: 0.7 % (ref 0.0–3.0)
Eosinophils Absolute: 0.2 10*3/uL (ref 0.0–0.7)
Eosinophils Relative: 3 % (ref 0.0–5.0)
HCT: 51.1 % (ref 39.0–52.0)
Hemoglobin: 16.6 g/dL (ref 13.0–17.0)
Lymphocytes Relative: 35.3 % (ref 12.0–46.0)
Lymphs Abs: 2.8 10*3/uL (ref 0.7–4.0)
MCHC: 32.6 g/dL (ref 30.0–36.0)
MCV: 88.5 fl (ref 78.0–100.0)
Monocytes Absolute: 0.7 10*3/uL (ref 0.1–1.0)
Monocytes Relative: 9.2 % (ref 3.0–12.0)
Neutro Abs: 4.1 10*3/uL (ref 1.4–7.7)
Neutrophils Relative %: 51.8 % (ref 43.0–77.0)
Platelets: 238 10*3/uL (ref 150.0–400.0)
RBC: 5.77 Mil/uL (ref 4.22–5.81)
RDW: 14.4 % (ref 11.5–15.5)
WBC: 7.9 10*3/uL (ref 4.0–10.5)

## 2023-01-06 LAB — COMPREHENSIVE METABOLIC PANEL
ALT: 19 U/L (ref 0–53)
AST: 15 U/L (ref 0–37)
Albumin: 4 g/dL (ref 3.5–5.2)
Alkaline Phosphatase: 71 U/L (ref 39–117)
BUN: 8 mg/dL (ref 6–23)
CO2: 29 mEq/L (ref 19–32)
Calcium: 8.9 mg/dL (ref 8.4–10.5)
Chloride: 101 mEq/L (ref 96–112)
Creatinine, Ser: 0.96 mg/dL (ref 0.40–1.50)
GFR: 88.65 mL/min (ref >60.00)
Glucose, Bld: 91 mg/dL (ref 70–99)
Potassium: 4.4 mEq/L (ref 3.5–5.1)
Sodium: 138 mEq/L (ref 135–145)
Total Bilirubin: 0.6 mg/dL (ref 0.2–1.2)
Total Protein: 7.3 g/dL (ref 6.0–8.3)

## 2023-01-06 LAB — LIPID PANEL
Cholesterol: 172 mg/dL (ref 0–200)
HDL: 39.6 mg/dL (ref >39.00)
NonHDL: 132.57
Total CHOL/HDL Ratio: 4
Triglycerides: 210 mg/dL — ABNORMAL HIGH (ref 0.0–149.0)
VLDL: 42 mg/dL — ABNORMAL HIGH (ref 0.0–40.0)

## 2023-01-06 LAB — LDL CHOLESTEROL, DIRECT: Direct LDL: 96 mg/dL

## 2023-01-06 LAB — TSH: TSH: 1.58 u[IU]/mL (ref 0.35–5.50)

## 2023-01-06 LAB — HEMOGLOBIN A1C: Hgb A1c MFr Bld: 6.2 % (ref 4.6–6.5)

## 2023-01-06 LAB — VITAMIN D 25 HYDROXY (VIT D DEFICIENCY, FRACTURES): VITD: 15.58 ng/mL — ABNORMAL LOW (ref 30.00–100.00)

## 2023-01-06 MED ORDER — RIVAROXABAN 10 MG PO TABS
10.0000 mg | ORAL_TABLET | Freq: Every day | ORAL | 5 refills | Status: DC
Start: 2023-01-06 — End: 2023-04-24

## 2023-01-06 NOTE — Assessment & Plan Note (Signed)
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 He has been managed by workman's comp at [employer] but the person who was handling his case is no longer there so they do not appear to be working with him regarding restrictions or return to work. He definitely wants to return to work and is doing much better but we have not seen him through any of this and are not able to release him from restrictions without input from his orthopaedist or workman's comp. He will return to Dr August Saucer at [facility] for evaluation and once that is done we can help with paperwork as needed.

## 2023-01-06 NOTE — Assessment & Plan Note (Signed)
Patient has been on blood thinners at least 3 x due to blood clot but after presenting with another DVT in February 2024 he was not maintained on a blood thinner he is placed back on Xarelto 10 mg daily and he is referred to hematology for work up of a clotting disorder.
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
## 2023-01-06 NOTE — Patient Instructions (Signed)
Deep Vein Thrombosis  Deep vein thrombosis (DVT) is a condition in which a blood clot forms in a vein of the deep venous system. This can occur in the lower leg, thigh, pelvis, arm, or neck. A clot is blood that has thickened into a gel or solid. This condition is serious and can be life-threatening if the clot travels to the arteries of the lungs and causes a blockage (pulmonary embolism). A DVT can also damage veins in the leg, which can lead to long-term venous disease, leg pain, swelling, discoloration, and ulcers or sores (post-thrombotic syndrome). What are the causes? This condition may be caused by: A slowdown of blood flow. Damage to a vein. A condition that causes blood to clot more easily, such as certain bleeding disorders. What increases the risk? The following factors may make you more likely to develop this condition: Obesity. Being older, especially older than age 60. Being inactive or not moving around (sedentary lifestyle). This may include: Sitting or lying down for longer than 4-6 hours other than to sleep at night. Being in the hospital, or having major or lengthy surgery. Having any recent bone injuries, such as breaks (fractures), that reduce movement, especially in the lower extremities. Having recent orthopedic surgery on the lower extremities. Being pregnant, giving birth, or having recently given birth. Taking medicines that contain estrogen, such as birth control or hormone replacement therapy. Using products that contain nicotine or tobacco, especially if you use hormonal birth control. Having a history of a blood vessel disease (peripheral vascular disease) or congestive heart disease. Having a history of cancer, especially if being treated with chemotherapy. What are the signs or symptoms? Symptoms of this condition include: Swelling, pain, pressure, or tenderness in an arm or a leg. An arm or a leg becoming warm, red, or discolored. A leg turning very pale or  blue. You may have a large DVT. This is rare. If the clot is in your leg, you may notice that symptoms get worse when you stand or walk. In some cases, there are no symptoms. How is this diagnosed? This condition is diagnosed with: Your medical history and a physical exam. Tests, such as: Blood tests to check how well your blood clots. Doppler ultrasound. This is the best way to find a DVT. CT venogram. Contrast dye is injected into a vein, and X-rays are taken to check for clots. This is helpful for veins in the chest or pelvis. How is this treated? Treatment for this condition depends on: The cause of your DVT. The size and location of your DVT, or having more than one DVT. Your risk for bleeding or developing more clots. Other medical conditions you may have. Treatment may include: Taking a blood thinner medicine (anticoagulant) to prevent more clots from forming or current clots from growing. Wearing compression stockings. Injecting medicines into the affected vein to break up the clot (catheter-directed thrombolysis). Surgical procedures, when DVT is severe or hard to treat. These may be done to: Isolate and remove your clot. Place an inferior vena cava (IVC) filter. This filter is placed into a large vein called the inferior vena cava to catch blood clots before they reach your lungs. You may get some medical treatments for 6 months or longer. Follow these instructions at home: If you are taking blood thinners: Talk with your health care provider before you take any medicines that contain aspirin or NSAIDs, such as ibuprofen. These medicines increase your risk for dangerous bleeding. Take your medicine exactly   as told, at the same time every day. Do not skip a dose. Do not take more than the prescribed dose. This is important. Ask your health care provider about foods and medicines that could change or interact with the way your blood thinner works. Avoid these foods and medicines  if you are told to do so. Avoid anything that may cause bleeding or bruising. You may bleed more easily while taking blood thinners. Be very careful when using knives, scissors, or other sharp objects. Use an electric razor instead of a blade. Avoid activities that could cause injury or bruising, and follow instructions for preventing falls. Tell your health care provider if you have had any internal bleeding, bleeding ulcers, or neurologic diseases, such as strokes or cerebral aneurysms. Wear a medical alert bracelet or carry a card that lists what medicines you take. General instructions Take over-the-counter and prescription medicines only as told by your health care provider. Return to your normal activities as told by your health care provider. Ask your health care provider what activities are safe for you. If recommended, wear compression stockings as told by your health care provider. These stockings help to prevent blood clots and reduce swelling in your legs. Never wear your compression stockings while sleeping at night. Keep all follow-up visits. This is important. Where to find more information American Heart Association: www.heart.org Centers for Disease Control and Prevention: www.cdc.gov National Heart, Lung, and Blood Institute: www.nhlbi.nih.gov Contact a health care provider if: You miss a dose of your blood thinner. You have unusual bruising or other color changes. You have new or worse pain, swelling, or redness in an arm or a leg. You have worsening numbness or tingling in an arm or a leg. You have a significant color change (pale or blue) in the extremity that has the DVT. Get help right away if: You have signs or symptoms that a blood clot has moved to the lungs. These may include: Shortness of breath. Chest pain. Fast or irregular heartbeats (palpitations). Light-headedness, dizziness, or fainting. Coughing up blood. You have signs or symptoms that your blood is  too thin. These may include: Blood in your vomit, stool, or urine. A cut that will not stop bleeding. A menstrual period that is heavier than usual. A severe headache or confusion. These symptoms may be an emergency. Get help right away. Call 911. Do not wait to see if the symptoms will go away. Do not drive yourself to the hospital. Summary Deep vein thrombosis (DVT) happens when a blood clot forms in a deep vein. This may occur in the lower leg, thigh, pelvis, arm, or neck. Symptoms affect the arm or leg and can include swelling, pain, tenderness, warmth, redness, or discoloration. This condition may be treated with medicines. In severe cases, a procedure or surgery may be done to remove or dissolve the clots. If you are taking blood thinners, take them exactly as told. Do not skip a dose. Do not take more than is prescribed. Get help right away if you have a severe headache, shortness of breath, chest pain, fast or irregular heartbeats, or blood in your vomit, urine, or stool. This information is not intended to replace advice given to you by your health care provider. Make sure you discuss any questions you have with your health care provider. Document Revised: 03/12/2021 Document Reviewed: 03/12/2021 Elsevier Patient Education  2023 Elsevier Inc.  

## 2023-01-07 ENCOUNTER — Other Ambulatory Visit: Payer: Self-pay

## 2023-01-07 MED ORDER — VITAMIN D (ERGOCALCIFEROL) 1.25 MG (50000 UNIT) PO CAPS: 50000.0000 [IU] | ORAL_CAPSULE | ORAL | 3 refills | Status: DC

## 2023-01-29 ENCOUNTER — Ambulatory Visit (INDEPENDENT_AMBULATORY_CARE_PROVIDER_SITE_OTHER): Payer: PRIVATE HEALTH INSURANCE | Admitting: Orthopedic Surgery

## 2023-01-29 ENCOUNTER — Encounter: Payer: Self-pay | Admitting: Orthopedic Surgery

## 2023-01-29 DIAGNOSIS — M25511 Pain in right shoulder: Secondary | ICD-10-CM | POA: Diagnosis not present
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
## 2023-02-06 ENCOUNTER — Other Ambulatory Visit: Payer: Self-pay

## 2023-02-06 MED ORDER — ATORVASTATIN CALCIUM 10 MG PO TABS: 10.0000 mg | ORAL_TABLET | Freq: Every day | ORAL | 3 refills | Status: DC

## 2023-02-06 MED ORDER — VITAMIN D (ERGOCALCIFEROL) 1.25 MG (50000 UNIT) PO CAPS: 50000.0000 [IU] | ORAL_CAPSULE | ORAL | 3 refills | Status: DC

## 2023-02-07 ENCOUNTER — Other Ambulatory Visit: Payer: Self-pay | Admitting: *Deleted

## 2023-02-07 ENCOUNTER — Other Ambulatory Visit: Payer: PRIVATE HEALTH INSURANCE

## 2023-02-07 DIAGNOSIS — E559 Vitamin D deficiency, unspecified: Secondary | ICD-10-CM

## 2023-02-07 DIAGNOSIS — E782 Mixed hyperlipidemia: Secondary | ICD-10-CM

## 2023-02-07 NOTE — Telephone Encounter (Signed)
Patient scheduled for 04/24/23 and at that time he will do lab work.

## 2023-03-07 ENCOUNTER — Ambulatory Visit
Admission: RE | Admit: 2023-03-07 | Discharge: 2023-03-07 | Disposition: A | Discharge status: Home / Self Care | Payer: PRIVATE HEALTH INSURANCE | Source: Ambulatory Visit | Attending: Orthopedic Surgery | Admitting: Orthopedic Surgery

## 2023-03-07 DIAGNOSIS — M25511 Pain in right shoulder: Secondary | ICD-10-CM

## 2023-03-07 MED ORDER — IOPAMIDOL (ISOVUE-M 200) INJECTION 41%
15.0000 mL | Freq: Once | INTRAMUSCULAR | Status: AC
  Administered 2023-03-07: 15 mL via INTRA_ARTICULAR

## 2023-03-14 ENCOUNTER — Encounter: Payer: Self-pay | Admitting: Orthopedic Surgery

## 2023-03-14 ENCOUNTER — Ambulatory Visit (INDEPENDENT_AMBULATORY_CARE_PROVIDER_SITE_OTHER): Payer: PRIVATE HEALTH INSURANCE | Admitting: Orthopedic Surgery

## 2023-03-14 DIAGNOSIS — M25511 Pain in right shoulder: Secondary | ICD-10-CM
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
## 2023-04-02 ENCOUNTER — Encounter (INDEPENDENT_AMBULATORY_CARE_PROVIDER_SITE_OTHER): Payer: Self-pay

## 2023-04-10 ENCOUNTER — Ambulatory Visit: Payer: PRIVATE HEALTH INSURANCE | Admitting: Family Medicine

## 2023-04-11 ENCOUNTER — Other Ambulatory Visit: Payer: PRIVATE HEALTH INSURANCE

## 2023-04-23 NOTE — Assessment & Plan Note (Addendum)
  hgba1c has hit 6.5 making him a new onset diabetic. See if he wants a nutrition consult. Then send him in Metformin 500 mg tabs, 1 tab po daily disp #30 with 3 rf as his insurance will require he try this prior to paying for the GLP1s. Of course he should avoid processed foods and simple carbohydrates as much as he can and if he is willing to check his sugars a couple times a week. Send him in a glucometer with tests strips and lancets to check his sugars daily and prn disp #100 with 2 rf. Have him keep a log of checking at least once weekly fasting and once 1-2 hours after his largest meal then bring log to next visit in 3 months. Set him up for labs prior to visit if he is willing and set up visit with provider after that. He should let us know if he has any concerning sugar numbers or does not tolerate his med. Want fasting sugars 70-130 and after eating ideally 100-160. Make sure he knows we are not immediately concerned well into the high 200s.

## 2023-04-23 NOTE — Assessment & Plan Note (Signed)
Encouraged heart healthy diet, increase exercise, avoid trans fats, consider a krill oil cap daily 

## 2023-04-23 NOTE — Assessment & Plan Note (Signed)
Is working with ortho, Dr August Saucer

## 2023-04-23 NOTE — Assessment & Plan Note (Addendum)
Supplement and monitor he is currently taking [id number] international units daily and his numbers are now low normal so will continue the same

## 2023-04-23 NOTE — Assessment & Plan Note (Addendum)
Needs to now be on lifetime anticoagulation due to recurrence.He had stopped his Xarelto. He is given a starter pack and will titrate back up to 20 mg daily. He is also referred to hematology for a definitive work up. He has recently been told that a family member with a clotting disorder.

## 2023-04-24 ENCOUNTER — Ambulatory Visit (INDEPENDENT_AMBULATORY_CARE_PROVIDER_SITE_OTHER): Payer: PRIVATE HEALTH INSURANCE | Admitting: Family Medicine

## 2023-04-24 VITALS — BP 128/72 | HR 84 | Temp 98.0°F | Resp 16 | Ht 70.0 in | Wt 319.0 lb

## 2023-04-24 DIAGNOSIS — E559 Vitamin D deficiency, unspecified: Secondary | ICD-10-CM | POA: Diagnosis not present

## 2023-04-24 DIAGNOSIS — R739 Hyperglycemia, unspecified: Secondary | ICD-10-CM

## 2023-04-24 DIAGNOSIS — E782 Mixed hyperlipidemia: Secondary | ICD-10-CM

## 2023-04-24 DIAGNOSIS — S4991XS Unspecified injury of right shoulder and upper arm, sequela: Secondary | ICD-10-CM

## 2023-04-24 DIAGNOSIS — I2699 Other pulmonary embolism without acute cor pulmonale: Secondary | ICD-10-CM

## 2023-04-24 DIAGNOSIS — I82409 Acute embolism and thrombosis of unspecified deep veins of unspecified lower extremity: Secondary | ICD-10-CM | POA: Diagnosis not present

## 2023-04-24 DIAGNOSIS — Z7984 Long term (current) use of oral hypoglycemic drugs: Secondary | ICD-10-CM | POA: Diagnosis not present

## 2023-04-24 DIAGNOSIS — E119 Type 2 diabetes mellitus without complications: Secondary | ICD-10-CM | POA: Diagnosis not present

## 2023-04-24 LAB — CBC WITH DIFFERENTIAL/PLATELET
Basophils Absolute: 0.1 10*3/uL (ref 0.0–0.1)
Basophils Relative: 0.8 % (ref 0.0–3.0)
Eosinophils Absolute: 0.1 10*3/uL (ref 0.0–0.7)
Eosinophils Relative: 2 % (ref 0.0–5.0)
HCT: 50 % (ref 39.0–52.0)
Hemoglobin: 15.9 g/dL (ref 13.0–17.0)
Lymphocytes Relative: 34.2 % (ref 12.0–46.0)
Lymphs Abs: 2.4 10*3/uL (ref 0.7–4.0)
MCHC: 31.8 g/dL (ref 30.0–36.0)
MCV: 89.3 fl (ref 78.0–100.0)
Monocytes Absolute: 0.7 10*3/uL (ref 0.1–1.0)
Monocytes Relative: 9.9 % (ref 3.0–12.0)
Neutro Abs: 3.7 10*3/uL (ref 1.4–7.7)
Neutrophils Relative %: 53.1 % (ref 43.0–77.0)
Platelets: 245 10*3/uL (ref 150.0–400.0)
RBC: 5.6 Mil/uL (ref 4.22–5.81)
RDW: 13.9 % (ref 11.5–15.5)
WBC: 7 10*3/uL (ref 4.0–10.5)

## 2023-04-24 LAB — COMPREHENSIVE METABOLIC PANEL
ALT: 19 U/L (ref 0–53)
AST: 15 U/L (ref 0–37)
Albumin: 3.8 g/dL (ref 3.5–5.2)
Alkaline Phosphatase: 75 U/L (ref 39–117)
BUN: 10 mg/dL (ref 6–23)
CO2: 31 mEq/L (ref 19–32)
Calcium: 9.1 mg/dL (ref 8.4–10.5)
Chloride: 101 meq/L (ref 96–112)
Creatinine, Ser: 0.98 mg/dL (ref 0.40–1.50)
GFR: 86.31 mL/min (ref >60.00)
Glucose, Bld: 107 mg/dL — ABNORMAL HIGH (ref 70–99)
Potassium: 4.6 meq/L (ref 3.5–5.1)
Sodium: 137 meq/L (ref 135–145)
Total Bilirubin: 0.7 mg/dL (ref 0.2–1.2)
Total Protein: 7.2 g/dL (ref 6.0–8.3)

## 2023-04-24 LAB — LIPID PANEL
Cholesterol: 155 mg/dL (ref 0–200)
HDL: 40.3 mg/dL (ref >39.00)
LDL Cholesterol: 83 mg/dL (ref 0–99)
NonHDL: 114.23
Total CHOL/HDL Ratio: 4
Triglycerides: 155 mg/dL — ABNORMAL HIGH (ref 0.0–149.0)
VLDL: 31 mg/dL (ref 0.0–40.0)

## 2023-04-24 LAB — HEMOGLOBIN A1C: Hgb A1c MFr Bld: 6.5 % (ref 4.6–6.5)

## 2023-04-24 MED ORDER — RIVAROXABAN 20 MG PO TABS: 20.0000 mg | ORAL_TABLET | Freq: Every day | ORAL | 5 refills | Status: DC

## 2023-04-24 NOTE — Patient Instructions (Signed)
Von Willebrand Disease  Von Willebrand disease is a bleeding disorder that happens when the body does not make enough of a protein that helps the blood clot. This protein is called von Willebrand factor (VWF). The disease also occurs when VWF does not work the way that it should. There is another protein in the blood called factor Vlll that helps blood to clot. When VWF levels are low or when VWF is not working properly, factor Vlll levels are also low. This makes the body less able to form stable blood clots, which puts you at risk for bleeding too much. There are 4 major types of von Willebrand disease. Types 1-3 are inherited. Type 1 is the mildest and most common type. People with this type have a low level of VWF and have decreased factor Vlll activity. Type 2 happens when VWF does not work as it should. Type 3 is a severe and rare type. People with this type usually have no VWF. Acquired von Willebrand disease happens when von Willebrand disease occurs as a result of another condition. What are the causes? This condition may be caused by: A gene that is passed from a parent to a child (inherited). Certain medical conditions that can cause the destruction of VWF, such as autoimmune disease, cancer, metabolic disorders, or metallic heart valves. This is considered acquired von Willebrand disease. What increases the risk? You are more likely to develop this condition if: You have a parent with this condition. You have medical conditions, such as certain types of autoimmune disease or cancer. What are the signs or symptoms? Symptoms of this condition include: Easy bruising. Nosebleeds that take longer than usual to stop. Heavy bleeding from the gums after a dental procedure. Heavy menstrual bleeding. Blood in your urine or stool (feces). Excessive or prolonged bleeding from a cut, after an accident or surgery, or after childbirth. Bleeding into muscles or joints. Bleeding into your muscle  makes the muscle feel painful, full, and firm. Bleeding into your joint makes the joint feel painful and swollen. In some cases, there are no symptoms. How is this diagnosed? This condition is diagnosed based on your symptoms and whether members of your family have a history of bleeding that lasts longer than normal. You may also have tests, including: Blood tests that check for: The ability of your blood to clot. The levels of VWF and factor Vlll in your blood. How well the VWF works. Genetic tests to confirm the diagnosis. How is this treated? Treatment for this condition depends on the type of von Willebrand disease you have and how severe it is. Often, treatment is not needed. If treatment is needed, it may include: Medicines to increase your production of VWF. These may be given through an injection, an IV inserted in a vein, or a nasal spray. Medicines to help your blood clot (clotting factor concentrates). These may be given after an injury, surgery, or another event that causes bleeding. Medicine applied directly to cuts (fibrin glue) to help the blood-clotting process. Medicines to prevent blood clots from breaking down too soon. Birth control medicines to prevent heavy menstrual bleeding in women. You may be referred to a blood specialist (hematologist) to help manage your condition. Follow these instructions at home: Medicines Take over-the-counter and prescription medicines only as told by your health care provider. Always check with your health care provider before you take any medicines. You will need to avoid over-the-counter medicines that can affect the clotting process. These include: Aspirin  and other medicines that contain salicylates. NSAIDs, such as ibuprofen. Ask your health care provider before taking over-the-counter medicines, vitamins, herbs, and supplements. Activity Exercise regularly. Exercise helps keep muscles flexible and helps prevent damage to muscles and  joints. Ask your health care provider what activities are safe for you. Activities that are usually safe for people with this condition include biking and walking. Activities that are not safe include contact sports, such as football, soccer, hockey, and wrestling. Always check with your health care provider before you start any exercise program. Safety Use seat belts. Avoid having furniture with sharp corners in the house. Be careful not to cut yourself when using sharp objects. Use an electric razor to shave instead of a blade. Follow instructions from your health care provider about ways that you can help prevent falls and injuries at home. These may include: Removing loose rugs, cords, and other tripping hazards from walkways. Using night-lights. General instructions  Tell your dentists and all other health care providers that you have von Willebrand disease. There are things that your health care providers can do to prevent or reduce bleeding during and after procedures. For example, medicine can be given during dental procedures to reduce bleeding. Brush your teeth using a soft toothbrush. Wear a medical alert bracelet that says you have von Willebrand disease. This can help you get the treatment you need in case of an emergency. If you cut yourself, have a nosebleed, or are bleeding for any other reason, apply consistent pressure to the area until the bleeding has stopped completely. This may take several minutes. Keep all follow-up visits. This is important. Where to find more information National Hemophilia Foundation: hemophilia.org Contact a health care provider if: Your treatment is not preventing bruising or bleeding. Get help right away if: You have bleeding that does not stop. You have severe joint pain and swelling. Summary Von Willebrand disease is a bleeding disorder that happens when the body does not make enough of a protein that helps the blood clot. This protein is  called von Willebrand factor (VWF). Treatment for this condition depends on the type of von Willebrand disease you have and how severe it is. Often, treatment is not needed. If treatment is needed, it may involve medicines to help with clotting or to prevent bleeding. Wear a medical alert bracelet that says you have von Willebrand disease. This can help you get the treatment you need in case of an emergency. This information is not intended to replace advice given to you by your health care provider. Make sure you discuss any questions you have with your health care provider. Document Revised: 08/06/2021 Document Reviewed: 08/06/2021 Elsevier Patient Education  2024 [employer].

## 2023-04-25 ENCOUNTER — Encounter: Payer: Self-pay | Admitting: Family Medicine

## 2023-04-25 LAB — VITAMIN D 25 HYDROXY (VIT D DEFICIENCY, FRACTURES): VITD: 30.79 ng/mL (ref 30.00–100.00)

## 2023-04-25 LAB — TSH: TSH: 1.23 u[IU]/mL (ref 0.35–5.50)
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
## 2023-04-25 NOTE — Assessment & Plan Note (Signed)
Encouraged DASH or MIND diet, decrease po intake and increase exercise as tolerated. Needs 7-8 hours of sleep nightly. Avoid trans fats, eat small, frequent meals every 4-5 hours with lean proteins, complex carbs and healthy fats. Minimize simple carbs, high fat foods and processed foods 

## 2023-04-28 ENCOUNTER — Other Ambulatory Visit: Payer: Self-pay

## 2023-04-29 ENCOUNTER — Other Ambulatory Visit: Payer: Self-pay

## 2023-04-29 MED ORDER — BLOOD GLUCOSE TEST VI STRP: 1.0000 | ORAL_STRIP | Freq: Three times a day (TID) | 0 refills | Status: AC

## 2023-04-29 MED ORDER — LANCET DEVICE MISC: 1.0000 | Freq: Three times a day (TID) | 0 refills | Status: AC

## 2023-04-29 MED ORDER — BLOOD GLUCOSE MONITORING SUPPL DEVI: 1.0000 | Freq: Three times a day (TID) | 0 refills | Status: DC

## 2023-04-29 MED ORDER — LANCETS MISC. MISC: 1.0000 | Freq: Three times a day (TID) | 0 refills | Status: AC

## 2023-04-29 MED ORDER — METFORMIN HCL 500 MG PO TABS: 500.0000 mg | ORAL_TABLET | Freq: Two times a day (BID) | ORAL | 3 refills | Status: DC

## 2023-05-08 ENCOUNTER — Encounter: Payer: Self-pay | Admitting: Family Medicine

## 2023-05-27 ENCOUNTER — Encounter: Payer: Self-pay | Admitting: Family Medicine

## 2023-05-28 ENCOUNTER — Other Ambulatory Visit: Payer: Self-pay | Admitting: Family

## 2023-05-28 MED ORDER — RIVAROXABAN 20 MG PO TABS: 20.0000 mg | ORAL_TABLET | Freq: Every day | ORAL | 5 refills | Status: DC

## 2023-06-09 MED ORDER — METFORMIN HCL 500 MG PO TABS: 500.0000 mg | ORAL_TABLET | Freq: Two times a day (BID) | ORAL | 3 refills | Status: DC

## 2023-06-10 ENCOUNTER — Encounter: Payer: Self-pay | Admitting: Family Medicine

## 2023-06-11 ENCOUNTER — Other Ambulatory Visit: Payer: Self-pay | Admitting: Family

## 2023-06-12 NOTE — Telephone Encounter (Signed)
Called pt was advised of results and stated he understand, pt stated he will do metformin. Pt stated he not wanting do glucometer at this time.

## 2023-06-13 ENCOUNTER — Ambulatory Visit: Payer: PRIVATE HEALTH INSURANCE | Admitting: Family Medicine

## 2023-07-04 ENCOUNTER — Encounter: Payer: Self-pay | Admitting: Family Medicine

## 2023-07-07 ENCOUNTER — Ambulatory Visit (INDEPENDENT_AMBULATORY_CARE_PROVIDER_SITE_OTHER): Payer: PRIVATE HEALTH INSURANCE | Admitting: Family Medicine

## 2023-07-07 ENCOUNTER — Ambulatory Visit (HOSPITAL_BASED_OUTPATIENT_CLINIC_OR_DEPARTMENT_OTHER)
Admission: RE | Admit: 2023-07-07 | Discharge: 2023-07-07 | Disposition: A | Discharge status: Home / Self Care | Payer: PRIVATE HEALTH INSURANCE | Source: Ambulatory Visit | Attending: Family Medicine | Admitting: Family Medicine

## 2023-07-07 ENCOUNTER — Encounter: Payer: Self-pay | Admitting: Family Medicine

## 2023-07-07 VITALS — BP 121/78 | HR 77 | Temp 97.5°F | Ht 70.0 in | Wt 322.0 lb

## 2023-07-07 DIAGNOSIS — R042 Hemoptysis: Secondary | ICD-10-CM | POA: Diagnosis present

## 2023-07-07 LAB — CBC WITH DIFFERENTIAL/PLATELET
Basophils Absolute: 0.1 10*3/uL (ref 0.0–0.1)
Basophils Relative: 1 % (ref 0.0–3.0)
Eosinophils Absolute: 0.1 10*3/uL (ref 0.0–0.7)
Eosinophils Relative: 2 % (ref 0.0–5.0)
HCT: 49 % (ref 39.0–52.0)
Hemoglobin: 15.3 g/dL (ref 13.0–17.0)
Lymphocytes Relative: 36.1 % (ref 12.0–46.0)
Lymphs Abs: 2.6 10*3/uL (ref 0.7–4.0)
MCHC: 31.2 g/dL (ref 30.0–36.0)
MCV: 89.7 fL (ref 78.0–100.0)
Monocytes Absolute: 0.6 10*3/uL (ref 0.1–1.0)
Monocytes Relative: 8.9 % (ref 3.0–12.0)
Neutro Abs: 3.8 10*3/uL (ref 1.4–7.7)
Neutrophils Relative %: 52 % (ref 43.0–77.0)
Platelets: 257 10*3/uL (ref 150.0–400.0)
RBC: 5.46 Mil/uL (ref 4.22–5.81)
RDW: 13.7 % (ref 11.5–15.5)
WBC: 7.3 10*3/uL (ref 4.0–10.5)

## 2023-07-07 LAB — D-DIMER, QUANTITATIVE: D-Dimer, Quant: 0.41 ug{FEU}/mL (ref <0.50)

## 2023-07-07 LAB — COMPREHENSIVE METABOLIC PANEL
ALT: 19 U/L (ref 0–53)
AST: 15 U/L (ref 0–37)
Albumin: 3.8 g/dL (ref 3.5–5.2)
Alkaline Phosphatase: 69 U/L (ref 39–117)
BUN: 11 mg/dL (ref 6–23)
CO2: 32 meq/L (ref 19–32)
Calcium: 9.2 mg/dL (ref 8.4–10.5)
Chloride: 102 meq/L (ref 96–112)
Creatinine, Ser: 1.08 mg/dL (ref 0.40–1.50)
GFR: 76.7 mL/min (ref >60.00)
Glucose, Bld: 100 mg/dL — ABNORMAL HIGH (ref 70–99)
Potassium: 5.2 meq/L — ABNORMAL HIGH (ref 3.5–5.1)
Sodium: 139 meq/L (ref 135–145)
Total Bilirubin: 0.4 mg/dL (ref 0.2–1.2)
Total Protein: 7.1 g/dL (ref 6.0–8.3)

## 2023-07-07 MED ORDER — AMOXICILLIN-POT CLAVULANATE 875-125 MG PO TABS: 1.0000 | ORAL_TABLET | Freq: Two times a day (BID) | ORAL | 0 refills | Status: DC

## 2023-07-07 MED ORDER — GUAIFENESIN ER 600 MG PO TB12: 1200.0000 mg | ORAL_TABLET | Freq: Two times a day (BID) | ORAL | 0 refills | Status: DC

## 2023-07-07 MED ORDER — BENZONATATE 200 MG PO CAPS: 200.0000 mg | ORAL_CAPSULE | Freq: Two times a day (BID) | ORAL | 0 refills | Status: DC | PRN

## 2023-07-07 NOTE — Patient Instructions (Signed)
Labs today Chest Xray today Starting Augmentin, can adjust based on imaging if needed Adding Tessalon and Mucinex for cough Continue supportive measures including rest, hydration, humidifier use, steam showers, warm compresses to sinuses, warm liquids with lemon and honey, and over-the-counter cough, cold, and analgesics as needed.

## 2023-07-07 NOTE — Progress Notes (Signed)
Acute Office Visit  Subjective:     Patient ID: Ross Spencer, male    DOB: Oct 18, 1965, 57 y.o.   MRN: [id number]  Chief Complaint  Patient presents with   Cough     Patient is in today for cough, hemoptysis.  Discussed the use of AI scribe software for clinical note transcription with the patient, who gave verbal consent to proceed.  History of Present Illness   The patient, with a history of being on Xarelto, presented with a cough that started about a week and a half ago. Initially, the cough was severe and was managed with Clarene Reamer, which helped to alleviate the symptoms and improve sleep. However, the cough later evolved into sneezing and a runny nose, which subsequently resolved, but was followed by chest congestion.  In the process of expectorating the congestion, the patient noticed blood in their sputum. The blood was described as dark red, not bright red, and was not associated with any chest pain or shortness of breath. The patient reported that the blood in the sputum has been consistent since the middle of last week, with the first expectoration usually being dark and subsequent ones becoming lighter until it disappears.  Alongside the blood, the patient has been coughing up yellow phlegm for about a week.. The patient denied any associated symptoms such as nausea, vomiting, or blood in bowel movements. The patient also denied having a fever.             All review of systems negative except what is listed in the HPI      Objective:    BP 121/78   Pulse 77   Temp (!) 97.5 F (36.4 C) (Oral)   Ht 5\' 10"  (1.778 m)   Wt (!) 322 lb (146.1 kg)   SpO2 98%   BMI 46.20 kg/m    Physical Exam Vitals reviewed.  Constitutional:      General: He is not in acute distress.    Appearance: Normal appearance. He is obese. He is not ill-appearing.  Cardiovascular:     Rate and Rhythm: Normal rate and regular rhythm.     Heart sounds: Normal heart sounds.   Pulmonary:     Effort: Pulmonary effort is normal.     Breath sounds: Normal breath sounds. No wheezing, rhonchi or rales.  Skin:    General: Skin is warm and dry.  Neurological:     Mental Status: He is alert and oriented to person, place, and time.  Psychiatric:        Mood and Affect: Mood normal.        Behavior: Behavior normal.        Thought Content: Thought content normal.        Judgment: Judgment normal.         No results found for any visits on 07/07/23.      Assessment & Plan:   Problem List Items Addressed This Visit   None Visit Diagnoses     Cough with hemoptysis    -  Primary   Relevant Medications   amoxicillin-clavulanate (AUGMENTIN) 875-125 MG tablet   benzonatate (TESSALON) 200 MG capsule   guaiFENesin (MUCINEX) 600 MG 12 hr tablet   Other Relevant Orders   CBC with Differential/Platelet   Comprehensive metabolic panel   D-Dimer, Quantitative   DG Chest 2 View     Labs today including d-dimer - low probability of clot given Xarelto use Chest Xray today Starting Augmentin, can  adjust based on imaging if needed Adding Tessalon and Mucinex for cough Continue supportive measures including rest, hydration, humidifier use, steam showers, warm compresses to sinuses, warm liquids with lemon and honey, and over-the-counter cough, cold, and analgesics as needed.  Patient aware of signs/symptoms requiring further/urgent evaluation.   Meds ordered this encounter  Medications   amoxicillin-clavulanate (AUGMENTIN) 875-125 MG tablet    Sig: Take 1 tablet by mouth 2 (two) times daily.    Dispense:  20 tablet    Refill:  0    Order Specific Question:   Supervising Provider    Answer:   Danise Edge A [[id number]]   benzonatate (TESSALON) 200 MG capsule    Sig: Take 1 capsule (200 mg total) by mouth 2 (two) times daily as needed for cough.    Dispense:  20 capsule    Refill:  0    Order Specific Question:   Supervising Provider    Answer:   Danise Edge A  [[id number]]   guaiFENesin (MUCINEX) 600 MG 12 hr tablet    Sig: Take 2 tablets (1,200 mg total) by mouth 2 (two) times daily.    Dispense:  30 tablet    Refill:  0    Order Specific Question:   Supervising Provider    Answer:   Danise Edge A [[id number]]    Return if symptoms worsen or fail to improve.  Clayborne Dana, NP

## 2023-10-14 ENCOUNTER — Encounter: Payer: PRIVATE HEALTH INSURANCE | Admitting: Family Medicine

## 2024-01-28 ENCOUNTER — Encounter: Payer: Self-pay | Admitting: Family Medicine

## 2024-02-01 NOTE — Assessment & Plan Note (Signed)
Supplement and monitor he is currently taking 40981 international units daily and his numbers are now low normal so will continue the same

## 2024-02-01 NOTE — Assessment & Plan Note (Signed)
 Encourage heart healthy diet such as MIND or DASH diet, increase exercise, avoid trans fats, simple carbohydrates and processed foods, consider a krill or fish or flaxseed oil cap daily.  monitor

## 2024-02-01 NOTE — Assessment & Plan Note (Signed)
 Encouraged DASH or MIND diet, decrease po intake and increase exercise as tolerated. Needs 7-8 hours of sleep nightly. Avoid trans fats, eat small, frequent meals every 4-5 hours with lean proteins, complex carbs and healthy fats. Minimize simple carbs, high fat foods and processed foods

## 2024-02-01 NOTE — Assessment & Plan Note (Signed)
 Encouraged heart healthy diet, increase exercise, avoid trans fats, consider a krill oil cap daily

## 2024-02-02 ENCOUNTER — Ambulatory Visit (INDEPENDENT_AMBULATORY_CARE_PROVIDER_SITE_OTHER): Payer: PRIVATE HEALTH INSURANCE | Admitting: Family Medicine

## 2024-02-02 ENCOUNTER — Ambulatory Visit: Payer: Self-pay | Admitting: Family Medicine

## 2024-02-02 ENCOUNTER — Encounter: Payer: Self-pay | Admitting: Family Medicine

## 2024-02-02 VITALS — BP 130/82 | HR 77 | Resp 16 | Ht 70.0 in | Wt 323.2 lb

## 2024-02-02 DIAGNOSIS — E559 Vitamin D deficiency, unspecified: Secondary | ICD-10-CM

## 2024-02-02 DIAGNOSIS — E782 Mixed hyperlipidemia: Secondary | ICD-10-CM | POA: Diagnosis not present

## 2024-02-02 DIAGNOSIS — R7989 Other specified abnormal findings of blood chemistry: Secondary | ICD-10-CM | POA: Diagnosis not present

## 2024-02-02 DIAGNOSIS — E785 Hyperlipidemia, unspecified: Secondary | ICD-10-CM

## 2024-02-02 DIAGNOSIS — E1169 Type 2 diabetes mellitus with other specified complication: Secondary | ICD-10-CM

## 2024-02-02 DIAGNOSIS — Z23 Encounter for immunization: Secondary | ICD-10-CM

## 2024-02-02 DIAGNOSIS — Z1159 Encounter for screening for other viral diseases: Secondary | ICD-10-CM

## 2024-02-02 LAB — CBC WITH DIFFERENTIAL/PLATELET
Basophils Absolute: 0 10*3/uL (ref 0.0–0.1)
Basophils Relative: 0.6 % (ref 0.0–3.0)
Eosinophils Absolute: 0.2 10*3/uL (ref 0.0–0.7)
Eosinophils Relative: 2.4 % (ref 0.0–5.0)
HCT: 49 % (ref 39.0–52.0)
Hemoglobin: 15.7 g/dL (ref 13.0–17.0)
Lymphocytes Relative: 40.1 % (ref 12.0–46.0)
Lymphs Abs: 2.8 10*3/uL (ref 0.7–4.0)
MCHC: 32 g/dL (ref 30.0–36.0)
MCV: 87.3 fl (ref 78.0–100.0)
Monocytes Absolute: 0.7 10*3/uL (ref 0.1–1.0)
Monocytes Relative: 10.2 % (ref 3.0–12.0)
Neutro Abs: 3.3 10*3/uL (ref 1.4–7.7)
Neutrophils Relative %: 46.7 % (ref 43.0–77.0)
Platelets: 228 10*3/uL (ref 150.0–400.0)
RBC: 5.61 Mil/uL (ref 4.22–5.81)
RDW: 14.7 % (ref 11.5–15.5)
WBC: 7 10*3/uL (ref 4.0–10.5)

## 2024-02-02 LAB — LIPID PANEL
Cholesterol: 178 mg/dL (ref 0–200)
HDL: 40.3 mg/dL (ref >39.00)
LDL Cholesterol: 102 mg/dL — ABNORMAL HIGH (ref 0–99)
NonHDL: 137.95
Total CHOL/HDL Ratio: 4
Triglycerides: 181 mg/dL — ABNORMAL HIGH (ref 0.0–149.0)
VLDL: 36.2 mg/dL (ref 0.0–40.0)

## 2024-02-02 LAB — COMPREHENSIVE METABOLIC PANEL WITH GFR
ALT: 16 U/L (ref 0–53)
AST: 14 U/L (ref 0–37)
Albumin: 4 g/dL (ref 3.5–5.2)
Alkaline Phosphatase: 76 U/L (ref 39–117)
BUN: 11 mg/dL (ref 6–23)
CO2: 29 meq/L (ref 19–32)
Calcium: 9 mg/dL (ref 8.4–10.5)
Chloride: 102 meq/L (ref 96–112)
Creatinine, Ser: 1.01 mg/dL (ref 0.40–1.50)
GFR: 82.79 mL/min
Glucose, Bld: 103 mg/dL — ABNORMAL HIGH (ref 70–99)
Potassium: 4.8 meq/L (ref 3.5–5.1)
Sodium: 137 meq/L (ref 135–145)
Total Bilirubin: 0.7 mg/dL (ref 0.2–1.2)
Total Protein: 7.3 g/dL (ref 6.0–8.3)

## 2024-02-02 LAB — VITAMIN D 25 HYDROXY (VIT D DEFICIENCY, FRACTURES): VITD: 31.99 ng/mL (ref 30.00–100.00)

## 2024-02-02 LAB — MICROALBUMIN / CREATININE URINE RATIO
Creatinine,U: 181.7 mg/dL
Microalb Creat Ratio: 4.9 mg/g (ref 0.0–30.0)
Microalb, Ur: 0.9 mg/dL (ref 0.0–1.9)

## 2024-02-02 LAB — TSH: TSH: 1.93 u[IU]/mL (ref 0.35–5.50)

## 2024-02-02 LAB — HEMOGLOBIN A1C: Hgb A1c MFr Bld: 6.4 % (ref 4.6–6.5)

## 2024-02-02 MED ORDER — BLOOD GLUCOSE MONITORING SUPPL DEVI: 1.0000 | Freq: Three times a day (TID) | 0 refills | Status: AC
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
## 2024-02-02 NOTE — Patient Instructions (Addendum)
 Consider MIND or Mediterranen diet  Second Shingrix at pharmacy  RSV, Respiratory Syncitial Virus Vaccine, Arexvy at pharmacy  Carbohydrate Counting for Diabetes Mellitus, Adult Carbohydrate counting is a method of keeping track of how many carbohydrates you eat. Eating carbohydrates increases the amount of sugar (glucose) in the blood. Counting how many carbohydrates you eat improves how well you manage your blood glucose. This, in turn, helps you manage your diabetes. Carbohydrates are measured in grams (g) per serving. It is important to know how many carbohydrates (in grams or by serving size) you can have in each meal. This is different for every person. A dietitian can help you make a meal plan and calculate how many carbohydrates you should have at each meal and snack. What foods contain carbohydrates? Carbohydrates are found in the following foods: Grains, such as breads and cereals. Dried beans and soy products. Starchy vegetables, such as potatoes, peas, and corn. Fruit and fruit juices. Milk and yogurt. Sweets and snack foods, such as cake, cookies, candy, chips, and soft drinks. How do I count carbohydrates in foods? There are two ways to count carbohydrates in food. You can read food labels or learn standard serving sizes of foods. You can use either of these methods or a combination of both. Using the Nutrition Facts label The Nutrition Facts list is included on the labels of almost all packaged foods and beverages in the [place] . It includes: The serving size. Information about nutrients in each serving, including the grams of carbohydrate per serving. To use the Nutrition Facts, decide how many servings you will have. Then, multiply the number of servings by the number of carbohydrates per serving. The resulting number is the total grams of carbohydrates that you will be having. Learning the standard serving sizes of foods When you eat carbohydrate foods that are not  packaged or do not include Nutrition Facts on the label, you need to measure the servings in order to count the grams of carbohydrates. Measure the foods that you will eat with a food scale or measuring cup, if needed. Decide how many standard-size servings you will eat. Multiply the number of servings by 15. For foods that contain carbohydrates, one serving equals 15 g of carbohydrates. For example, if you eat 2 cups or 10 oz (300 g) of strawberries, you will have eaten 2 servings and 30 g of carbohydrates (2 servings x 15 g = 30 g). For foods that have more than one food mixed, such as soups and casseroles, you must count the carbohydrates in each food that is included. The following list contains standard serving sizes of common carbohydrate-rich foods. Each of these servings has about 15 g of carbohydrates: 1 slice of bread. 1 six-inch (15 cm) tortilla. ? cup or 2 oz (53 g) cooked rice or pasta.  cup or 3 oz (85 g) cooked or canned, drained and rinsed beans or lentils.  cup or 3 oz (85 g) starchy vegetable, such as peas, corn, or squash.  cup or 4 oz (120 g) hot cereal.  cup or 3 oz (85 g) boiled or mashed potatoes, or  or 3 oz (85 g) of a large baked potato.  cup or 4 fl oz (118 mL) fruit juice. 1 cup or 8 fl oz (237 mL) milk. 1 small or 4 oz (106 g) apple.  or 2 oz (63 g) of a medium banana. 1 cup or 5 oz (150 g) strawberries. 3 cups or 1 oz (28.3 g) popped popcorn.  What is an example of carbohydrate counting? To calculate the grams of carbohydrates in this sample meal, follow the steps shown below. Sample meal 3 oz (85 g) chicken breast. ? cup or 4 oz (106 g) brown rice.  cup or 3 oz (85 g) corn. 1 cup or 8 fl oz (237 mL) milk. 1 cup or 5 oz (150 g) strawberries with sugar-free whipped topping. Carbohydrate calculation Identify the foods that contain carbohydrates: Rice. Corn. Milk. Strawberries. Calculate how many servings you have of each food: 2 servings rice. 1  serving corn. 1 serving milk. 1 serving strawberries. Multiply each number of servings by 15 g: 2 servings rice x 15 g = 30 g. 1 serving corn x 15 g = 15 g. 1 serving milk x 15 g = 15 g. 1 serving strawberries x 15 g = 15 g. Add together all of the amounts to find the total grams of carbohydrates eaten: 30 g + 15 g + 15 g + 15 g = 75 g of carbohydrates total. What are tips for following this plan? Shopping Develop a meal plan and then make a shopping list. Buy fresh and frozen vegetables, fresh and frozen fruit, dairy, eggs, beans, lentils, and whole grains. Look at food labels. Choose foods that have more fiber and less sugar. Avoid processed foods and foods with added sugars. Meal planning Aim to have the same number of grams of carbohydrates at each meal and for each snack time. Plan to have regular, balanced meals and snacks. Where to find more information American Diabetes Association: diabetes.org Centers for Disease Control and Prevention: TonerPromos.no Academy of Nutrition and Dietetics: eatright.org Association of Diabetes Care & Education Specialists: diabeteseducator.org Summary Carbohydrate counting is a method of keeping track of how many carbohydrates you eat. Eating carbohydrates increases the amount of sugar (glucose) in your blood. Counting how many carbohydrates you eat improves how well you manage your blood glucose. This helps you manage your diabetes. A dietitian can help you make a meal plan and calculate how many carbohydrates you should have at each meal and snack. This information is not intended to replace advice given to you by your health care provider. Make sure you discuss any questions you have with your health care provider. Document Revised: 03/21/2020 Document Reviewed: 03/22/2020 Elsevier Patient Education  2024 Else[employer]an diet

## 2024-02-02 NOTE — Assessment & Plan Note (Signed)
 Check labs and then advise on adding Mounjaro and whether to restart Metformin  or not.

## 2024-02-03 LAB — HEPATITIS C ANTIBODY: Hepatitis C Ab: NONREACTIVE

## 2024-02-05 ENCOUNTER — Encounter: Payer: Self-pay | Admitting: Family Medicine

## 2024-04-28 ENCOUNTER — Encounter: Payer: Self-pay | Admitting: Family Medicine
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
## 2024-04-30 NOTE — Assessment & Plan Note (Addendum)
 Supplement and monitor. Recheck today. Pt has been taking 2000 units Vit D daily.

## 2024-04-30 NOTE — Assessment & Plan Note (Addendum)
 BMI Readings from Last 1 Encounters:  05/05/24 45.83 kg/m     Encouraged DASH or MIND diet, decrease po intake and increase exercise as tolerated. Needs 7-8 hours of sleep nightly. Avoid trans fats, eat small, frequent meals every 4-5 hours with lean proteins, complex carbs and healthy fats. Minimize simple carbs, high fat foods and processed foods

## 2024-05-03 NOTE — Assessment & Plan Note (Signed)
 Encourage heart healthy diet such as MIND or DASH diet, increase exercise, avoid trans fats, simple carbohydrates and processed foods, consider a krill or fish or flaxseed oil cap daily.  monitor

## 2024-05-03 NOTE — Assessment & Plan Note (Signed)
 hgba1c acceptable, minimize simple carbs. Increase exercise as tolerated. Continue current meds

## 2024-05-05 ENCOUNTER — Ambulatory Visit (INDEPENDENT_AMBULATORY_CARE_PROVIDER_SITE_OTHER): Payer: PRIVATE HEALTH INSURANCE | Admitting: Student

## 2024-05-05 ENCOUNTER — Other Ambulatory Visit (HOSPITAL_BASED_OUTPATIENT_CLINIC_OR_DEPARTMENT_OTHER): Payer: Self-pay

## 2024-05-05 ENCOUNTER — Encounter: Payer: Self-pay | Admitting: Student

## 2024-05-05 VITALS — BP 126/80 | HR 77 | Temp 98.4°F | Resp 12 | Ht 70.0 in | Wt 319.4 lb

## 2024-05-05 DIAGNOSIS — E559 Vitamin D deficiency, unspecified: Secondary | ICD-10-CM | POA: Diagnosis not present

## 2024-05-05 DIAGNOSIS — R7989 Other specified abnormal findings of blood chemistry: Secondary | ICD-10-CM

## 2024-05-05 DIAGNOSIS — E1169 Type 2 diabetes mellitus with other specified complication: Secondary | ICD-10-CM

## 2024-05-05 DIAGNOSIS — Z7984 Long term (current) use of oral hypoglycemic drugs: Secondary | ICD-10-CM

## 2024-05-05 DIAGNOSIS — E785 Hyperlipidemia, unspecified: Secondary | ICD-10-CM | POA: Diagnosis not present

## 2024-05-05 LAB — LIPID PANEL
Cholesterol: 171 mg/dL (ref 0–200)
HDL: 36.1 mg/dL — ABNORMAL LOW (ref >39.00)
LDL Cholesterol: 97 mg/dL (ref 0–99)
NonHDL: 135.39
Total CHOL/HDL Ratio: 5
Triglycerides: 193 mg/dL — ABNORMAL HIGH (ref 0.0–149.0)
VLDL: 38.6 mg/dL (ref 0.0–40.0)

## 2024-05-05 LAB — HEMOGLOBIN A1C: Hgb A1c MFr Bld: 6.6 % — ABNORMAL HIGH (ref 4.6–6.5)

## 2024-05-05 LAB — VITAMIN D 25 HYDROXY (VIT D DEFICIENCY, FRACTURES): VITD: 27.57 ng/mL — ABNORMAL LOW (ref 30.00–100.00)

## 2024-05-05 MED ORDER — ZOSTER VAC RECOMB ADJUVANTED 50 MCG/0.5ML IM SUSR
0.5000 mL | Freq: Once | INTRAMUSCULAR | 0 refills | Status: AC
  Filled 2024-05-05: qty 0.5, 1d supply, fill #0

## 2024-05-05 MED ORDER — FLUZONE 0.5 ML IM SUSY
0.5000 mL | PREFILLED_SYRINGE | Freq: Once | INTRAMUSCULAR | 0 refills | Status: AC
Start: 2024-05-05 — End: 2024-05-06
  Filled 2024-05-05: qty 0.5, 1d supply, fill #0

## 2024-05-05 MED ORDER — TIRZEPATIDE 2.5 MG/0.5ML ~~LOC~~ SOAJ: 2.5000 mg | SUBCUTANEOUS | 1 refills | Status: DC

## 2024-05-05 MED ORDER — ATORVASTATIN CALCIUM 10 MG PO TABS
10.0000 mg | ORAL_TABLET | Freq: Every day | ORAL | 1 refills | Status: AC
Start: 2024-05-05 — End: ?

## 2024-05-06 ENCOUNTER — Ambulatory Visit: Payer: Self-pay | Admitting: Student

## 2024-05-07 ENCOUNTER — Other Ambulatory Visit (HOSPITAL_COMMUNITY): Payer: Self-pay

## 2024-05-07 ENCOUNTER — Telehealth: Payer: Self-pay

## 2024-05-07 ENCOUNTER — Telehealth: Payer: Self-pay | Admitting: *Deleted

## 2024-05-07 NOTE — Telephone Encounter (Signed)
 Can you please do Mounjaro  PA.

## 2024-05-07 NOTE — Telephone Encounter (Signed)
 PA request has been Submitted. New Encounter has been or will be created for follow up. For additional info see Pharmacy Prior Auth telephone encounter from 05/07/24.

## 2024-05-07 NOTE — Telephone Encounter (Signed)
 Pharmacy Patient Advocate Encounter   Received notification from Pt Calls Messages that prior authorization for MOUNJARO  2.5 MG/0.5 ML PEN is required/requested.   Insurance verification completed.   The patient is insured through [facility] .   Per test claim: PA required; PA submitted to above mentioned insurance via Prompt PA Key/confirmation #/EOC [id number] Status is pending      Zip code: [id number]

## 2024-05-12 ENCOUNTER — Other Ambulatory Visit (HOSPITAL_COMMUNITY): Payer: Self-pay

## 2024-05-12 NOTE — Telephone Encounter (Signed)
 Pharmacy Patient Advocate Encounter  Received notification from [employer] that Prior Authorization for  MOUNJARO  2.5 MG/0.5 ML PEN  has been APPROVED from 05/11/24 to 05/11/25. Ran test claim, Copay is $25. This test claim was processed through [facility]- copay amounts may vary at other pharmacies due to pharmacy/plan contracts, or as the patient moves through the different stages of their insurance plan.   PA #/Case ID/Reference #: [id number]

## 2024-05-20 ENCOUNTER — Other Ambulatory Visit: Payer: Self-pay | Admitting: Family Medicine

## 2024-06-09 ENCOUNTER — Other Ambulatory Visit: Payer: Self-pay | Admitting: Family Medicine

## 2024-07-14 ENCOUNTER — Other Ambulatory Visit: Payer: Self-pay | Admitting: Family Medicine

## 2024-07-14 DIAGNOSIS — E1169 Type 2 diabetes mellitus with other specified complication: Secondary | ICD-10-CM

## 2024-07-14 MED ORDER — TIRZEPATIDE 2.5 MG/0.5ML ~~LOC~~ SOAJ: 2.5000 mg | SUBCUTANEOUS | 1 refills | Status: DC

## 2024-07-14 NOTE — Telephone Encounter (Signed)
 Copied from CRM #[id number]. Topic: Clinical - Medication Refill >> Jul 14, 2024 10:33 AM Ross Spencer wrote: Medication: tirzepatide  (MOUNJARO ) 2.5 MG/0.5ML Pen  Has the patient contacted their pharmacy? Yes (Agent: If no, request that the patient contact the pharmacy for the refill. If patient does not wish to contact the pharmacy document the reason why and proceed with request.) (Agent: If yes, when and what did the pharmacy advise?) Pharmacy calling for refill.  This is the patient's preferred pharmacy:  [facility] - [place], [place] - [address] [place] [address] Phone: [phone] Fax: [phone]  Is this the correct pharmacy for this prescription? Yes If no, delete pharmacy and type the correct one.   Has the prescription been filled recently? No  Is the patient out of the medication? No  Has the patient been seen for an appointment in the last year OR does the patient have an upcoming appointment? Yes  Can we respond through MyChart? Yes  Agent: Please be advised that Rx refills may take up to 3 business days. We ask that you follow-up with your pharmacy.

## 2024-07-24 ENCOUNTER — Other Ambulatory Visit: Payer: Self-pay | Admitting: Student

## 2024-07-24 DIAGNOSIS — E1169 Type 2 diabetes mellitus with other specified complication: Secondary | ICD-10-CM

## 2024-08-04 ENCOUNTER — Encounter: Payer: Self-pay | Admitting: Student

## 2024-08-04 DIAGNOSIS — E119 Type 2 diabetes mellitus without complications: Secondary | ICD-10-CM | POA: Insufficient documentation
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
## 2024-08-05 ENCOUNTER — Ambulatory Visit: Payer: PRIVATE HEALTH INSURANCE | Admitting: Student

## 2024-08-05 ENCOUNTER — Ambulatory Visit: Payer: Self-pay | Admitting: Student

## 2024-08-05 ENCOUNTER — Encounter: Payer: Self-pay | Admitting: Student

## 2024-08-05 VITALS — BP 123/77 | HR 71 | Ht 70.0 in | Wt 315.6 lb

## 2024-08-05 DIAGNOSIS — E782 Mixed hyperlipidemia: Secondary | ICD-10-CM

## 2024-08-05 DIAGNOSIS — E119 Type 2 diabetes mellitus without complications: Secondary | ICD-10-CM

## 2024-08-05 DIAGNOSIS — E559 Vitamin D deficiency, unspecified: Secondary | ICD-10-CM

## 2024-08-05 LAB — LIPID PANEL
Cholesterol: 114 mg/dL (ref 0–200)
HDL: 36.4 mg/dL — ABNORMAL LOW (ref >39.00)
LDL Cholesterol: 56 mg/dL (ref 0–99)
NonHDL: 77.26
Total CHOL/HDL Ratio: 3
Triglycerides: 106 mg/dL (ref 0.0–149.0)
VLDL: 21.2 mg/dL (ref 0.0–40.0)

## 2024-08-05 LAB — HEMOGLOBIN A1C: Hgb A1c MFr Bld: 5.8 % (ref 4.6–6.5)

## 2024-08-05 LAB — VITAMIN D 25 HYDROXY (VIT D DEFICIENCY, FRACTURES): VITD: 21.78 ng/mL — ABNORMAL LOW (ref 30.00–100.00)

## 2024-08-05 MED ORDER — VITAMIN D (ERGOCALCIFEROL) 1.25 MG (50000 UNIT) PO CAPS: 50000.0000 [IU] | ORAL_CAPSULE | ORAL | 3 refills | Status: AC

## 2024-08-05 MED ORDER — TIRZEPATIDE 5 MG/0.5ML ~~LOC~~ SOAJ: 5.0000 mg | SUBCUTANEOUS | 1 refills | Status: AC

## 2024-08-05 NOTE — Assessment & Plan Note (Signed)
 Supplement and monitor

## 2024-08-05 NOTE — Assessment & Plan Note (Addendum)
 Tolerating Statin. Encouraged heart healthy diet, increase exercise, avoid trans fats, consider a krill oil cap daily. Recheck lipid panel.

## 2024-08-05 NOTE — Assessment & Plan Note (Signed)
 Encouraged DASH or MIND diet, decrease po intake and increase exercise as tolerated. Needs 7-8 hours of sleep nightly. Avoid trans fats, eat small, frequent meals every 4-5 hours with lean proteins, complex carbs and healthy fats. Minimize simple carbs, high fat foods and processed foods

## 2024-08-05 NOTE — Assessment & Plan Note (Addendum)
 On Mounjaro , increase to 5 mg inj weekly. On statin hgba1c acceptable, minimize simple carbs. Increase exercise as tolerated. Continue current meds. Update A1C

## 2024-09-21 ENCOUNTER — Encounter: Payer: Self-pay | Admitting: Student

## 2024-11-01 ENCOUNTER — Encounter: Payer: PRIVATE HEALTH INSURANCE | Admitting: Family Medicine
